# Patient Record
Sex: Male | Born: 1950 | Race: White | Hispanic: No | Marital: Single | Smoking: Current every day smoker
Health system: Southern US, Community
[De-identification: ages and names within clinical notes are randomized; demographics above are authoritative.]

## PROBLEM LIST (undated history)

## (undated) DIAGNOSIS — M1990 Unspecified osteoarthritis, unspecified site: Secondary | ICD-10-CM

## (undated) DIAGNOSIS — G4730 Sleep apnea, unspecified: Secondary | ICD-10-CM

## (undated) DIAGNOSIS — I739 Peripheral vascular disease, unspecified: Secondary | ICD-10-CM

## (undated) DIAGNOSIS — I10 Essential (primary) hypertension: Secondary | ICD-10-CM

## (undated) DIAGNOSIS — K219 Gastro-esophageal reflux disease without esophagitis: Secondary | ICD-10-CM

## (undated) HISTORY — PX: RHINOPLASTY: SUR1284

## (undated) HISTORY — PX: HERNIA REPAIR: SHX51

## (undated) HISTORY — PX: OTHER SURGICAL HISTORY: SHX169

## (undated) HISTORY — PX: CONJUNCTIVOPLASTY: SHX1390

## (undated) HISTORY — PX: COLONOSCOPY: SHX174

---

## 2020-09-28 ENCOUNTER — Ambulatory Visit: Payer: Self-pay

## 2020-09-28 ENCOUNTER — Ambulatory Visit (INDEPENDENT_AMBULATORY_CARE_PROVIDER_SITE_OTHER): Payer: Medicare PPO | Admitting: Orthopaedic Surgery

## 2020-09-28 VITALS — Ht 70.0 in | Wt 223.0 lb

## 2020-09-28 DIAGNOSIS — M1612 Unilateral primary osteoarthritis, left hip: Secondary | ICD-10-CM

## 2020-09-28 DIAGNOSIS — M25552 Pain in left hip: Secondary | ICD-10-CM

## 2020-09-28 NOTE — Progress Notes (Signed)
Office Visit Note   Patient: Samuel Dougherty           Date of Birth: September 21, 1950           MRN: [id number] Visit Date: 09/28/2020              Requested by: No referring provider defined for this encounter. PCP: No primary care provider on file.   Assessment & Plan: Visit Diagnoses:  1. Pain in left hip   2. Unilateral primary osteoarthritis, left hip     Plan: The patient does have severe end-stage arthritis of his left hip and this is definitely demonstrated on clinical exam and x-ray findings.  Given the failure of all forms of conservative treatment over 12 months, I do feel a hip replacement is warranted at this standpoint.  I showed him his x-rays and went over hip replacement model.  We described in detail what the surgery involves as well as discussed the risk and benefits of surgery in detail.  I gave him a handout about the surgery and showed him what hip replacements look like.  All questions and concerns were answered and addressed.  We will work on getting this scheduled.  Follow-Up Instructions: Return for 2 weeks post-op.   Orders:  Orders Placed This Encounter  Procedures   XR HIP UNILAT W OR W/O PELVIS 1V LEFT   No orders of the defined types were placed in this encounter.     Procedures: No procedures performed   Clinical Data: No additional findings.   Subjective: Chief Complaint  Patient presents with   Left Hip - Pain  The patient is a very pleasant 70 year old gentleman who comes all the way from [facility] to see me for his left hip.  He has known severe end-stage arthritis in that left hip and has been recommended that he undergo hip replacement surgery.  He would like this to be done through a direct anterior approach in his heard about me and came all this way for [place] to see him.  He does report severe left hip pain that is daily.  It is detrimentally affecting his mobility, his quality of life and his actives daily living.  He has had  at least 3 intra-articular steroid injections over time and that left hip joint.  This is gotten significantly worse over the last 12 months and activity modification has not helped.  He is worked on strengthening his hip and is lost a considerable amount of weight.  His BMI is now only 32.  He cannot take anti-inflammatories because he is on Plavix.  He is now diabetic.  At this point he wishes proceed with hip replacement surgery and would like to discuss this today.  HPI  Review of Systems   Objective: Vital Signs: Ht 5\' 10"  (1.778 m)   Wt 223 lb (101.2 kg)   BMI 32.00 kg/m   Physical Exam He is alert and orient x3 and in no acute distress Ortho Exam Examination of his left hip shows severe pain on internal and external rotation and grinding in that left hip on rotation.  The right hip moves smoothly and fluidly with no pain. Specialty Comments:  No specialty comments available.  Imaging: XR HIP UNILAT W OR W/O PELVIS 1V LEFT  Result Date: 09/28/2020 An AP pelvis and lateral left hip shows severe end-stage arthritis of the left hip.  The joint space is completely lost.  There is a small cystic change in the  acetabulum and femoral head.  This is the classic definition of bone-on-bone arthritis.  His right hip does have moderate arthritic changes with joint space narrowing.    PMFS History: Patient Active Problem List   Diagnosis Date Noted   Unilateral primary osteoarthritis, left hip 09/28/2020   No past medical history on file.  No family history on file.   Social History   Occupational History   Not on file  Tobacco Use   Smoking status: Not on file   Smokeless tobacco: Not on file  Substance and Sexual Activity   Alcohol use: Not on file   Drug use: Not on file   Sexual activity: Not on file

## 2020-09-30 ENCOUNTER — Telehealth: Payer: Self-pay | Admitting: Orthopaedic Surgery

## 2020-09-30 NOTE — Telephone Encounter (Signed)
Called and spoke with patient letting him know there are many in front of him being scheduled and that Sherrie will call usually within a week and a half of office visit to schedule. I also reminded him that surgery will most likely not be until August

## 2020-09-30 NOTE — Telephone Encounter (Signed)
Pt called stating he has been trying to get an appt scheduled all week. He states he has left messages and he hasn't even gotten a CB to let him know how much longer he may have to wait. The pt has to drive 2 hrs and he is trying to get info without having to come up here. Pt would really appreciate a CB ASAP as he's been waiting a week and he can't walk.   [phone]

## 2020-10-05 ENCOUNTER — Ambulatory Visit: Payer: Medicare PPO | Admitting: Orthopaedic Surgery

## 2020-10-07 ENCOUNTER — Telehealth: Payer: Self-pay

## 2020-10-07 NOTE — Telephone Encounter (Signed)
I called and left voice mail for return call to schedule. 

## 2020-10-07 NOTE — Telephone Encounter (Signed)
Pt called returning the missed call and asks Sherrie to try him again before the day is out.  [phone]

## 2020-10-07 NOTE — Telephone Encounter (Signed)
I called patient and left voice mail for return call to schedule surgery. 

## 2020-10-10 ENCOUNTER — Telehealth: Payer: Self-pay | Admitting: Orthopaedic Surgery

## 2020-10-10 NOTE — Telephone Encounter (Signed)
Pt called stating he missed a call from [facility] and would like to be tried again.

## 2020-10-10 NOTE — Telephone Encounter (Signed)
Called patient and left voicemail for return call.

## 2020-10-18 ENCOUNTER — Telehealth: Payer: Self-pay

## 2020-10-18 ENCOUNTER — Telehealth: Payer: Self-pay | Admitting: Orthopaedic Surgery

## 2020-10-18 NOTE — Telephone Encounter (Signed)
Patient called he is requesting a medical records release form to be sent to him, patient is requesting all of his records including x rays to be sent email:[email or web address] call [phone]  ** patient needs records asap

## 2020-10-18 NOTE — Telephone Encounter (Signed)
Please see below message.

## 2020-10-18 NOTE — Telephone Encounter (Signed)
Received call from pt, he wants release form sent to him so he can go to another doctor to do his surgery. He states he wants Dr. Magnus Ivan to do his surgery but, cannot get anyone to call him back. He asked that I relay this information to Dr Magnus Ivan because he needs to know. He states he is willing to wait for Magnus Ivan, but can't move forward to even talk to someone to get it scheduled. If possible, he would like to talk to Dr Magnus Ivan himself and would really ALSO, like for someone to call him today to schedule his surgery [phone]

## 2020-10-18 NOTE — Telephone Encounter (Signed)
I called patient and he answered the phone.  I explained to him that I had left him multiple messages about scheduling surgery.  I even left him a message about the surgery date and asked him to call me back for details.  He stated that it was probably his phone service.  He does want Dr. Magnus Ivan to do the surgery.  He asked me to e-mail him the information because he was in line somewhere with nothing to write with.  I e-mailed the following to [email or web address]  STOP YOUR PLAVIX, TAKE LAST DOSE ON 11/17/20.    1.  Your surgery has been scheduled at [facility], [address] [place], [place].  If you have any questions, please feel free to call our office and ask for Ryan Ogborn.      2.  The hospital will contact you for your pre-admission laboratory tests and financial arrangements, etc.  Should you need to call them back, the number is [phone].        3.  Your surgery is scheduled for   Friday, 11/25/20   .  Time: to be determined.  On the day of surgery, you will need to report to the hospital at time to be determined.  **Please note your surgery time is subject to change.  The hospital   will confirm your surgery time with you at your pre-op appointment .**      4.  IT IS OF THE UTMOST IMPORTANCE THAT YOU DO NOT EAT OR DRINK ANYTHING AFTER MIDNIGHT THE NIGHT BEFORE YOUR SURGERY...DO NOT SWALLOW ANYTHING, UNLESS GIVEN INSTRUCTIONS FROM THE HOSPITAL STAFF TO DO SO.      5.  You will be assigned to a room following surgery.  Dress comfortably and do not take any jewelry or valuables with you to the hospital.     Your first post-operative appointment is scheduled for Thursday, 12/08/20 2:00 p.m.

## 2020-10-21 ENCOUNTER — Other Ambulatory Visit: Payer: Self-pay

## 2020-11-14 NOTE — Progress Notes (Signed)
Sent message, via epic in basket, requesting orders in epic from surgeon.  

## 2020-11-17 NOTE — Patient Instructions (Signed)
DUE TO COVID-19 ONLY ONE VISITOR IS ALLOWED TO COME WITH YOU AND STAY IN THE WAITING ROOM ONLY DURING PRE OP AND PROCEDURE.   **NO VISITORS ARE ALLOWED IN THE SHORT STAY AREA OR RECOVERY ROOM!!**  IF YOU WILL BE ADMITTED INTO THE HOSPITAL YOU ARE ALLOWED ONLY TWO SUPPORT PEOPLE DURING VISITATION HOURS ONLY (10AM -8PM)   The support person(s) may change daily. The support person(s) must pass our screening, gel in and out, and wear a mask at all times, including in the patient's room. Patients must also wear a mask when staff or their support person are in the room.  No visitors under the age of 37. Any visitor under the age of 45 must be accompanied by an adult.    COVID SWAB TESTING MUST BE COMPLETED ON:  11/23/20 **MUST PRESENT COMPLETED FORM AT TESTING SITE**    [address] [place] (backside of the building) Open 8am-3pm. No appointment needed. You are not required to quarantine, however you are required to wear a well-fitted mask when you are out and around people not in your household.  Hand Hygiene often Do NOT share personal items Notify your provider if you are in close contact with someone who has COVID or you develop fever 100.4 or greater, new onset of sneezing, cough, sore throat, shortness of breath or body aches.   Your procedure is scheduled on: 11/25/20   Report to [facility] Main  Entrance    Report to admitting at 7:00 AM   Call this number if you have problems the morning of surgery [phone]   Do not eat food :After Midnight.   May have liquids until  6:30 AM  day of surgery  CLEAR LIQUID DIET  Foods Allowed                                                                     Foods Excluded  Water, Black Coffee and tea, regular and decaf               liquids that you cannot  Plain Jell-O in any flavor  (No red)                                    see through such as: Fruit ices (not with fruit pulp)                                             milk, soups, orange juice              Iced Popsicles (No red)                                               All solid food  Apple juices Sports drinks like Gatorade (No red) Lightly seasoned clear broth or consume(fat free) Sugar, honey syrup      The day of surgery:  Drink ONE (1) Pre-Surgery Clear Ensure or G2 by am the morning of surgery. Drink in one sitting. Do not sip.  This drink was given to you during your hospital  pre-op appointment visit. Nothing else to drink after completing the  Pre-Surgery Clear Ensure or G2.          If you have questions, please contact your surgeon's office.     Oral Hygiene is also important to reduce your risk of infection.                                    Remember - BRUSH YOUR TEETH THE MORNING OF SURGERY WITH YOUR REGULAR TOOTHPASTE   Do NOT smoke after Midnight   Take these medicines the morning of surgery with A SIP OF WATER: Atenolol, Atorvastatin, Omeprazole, Oxycodone, Pramipexole, Tamsulosin.   DO NOT TAKE ANY ORAL DIABETIC MEDICATIONS DAY OF YOUR SURGERY                              You may not have any metal on your body including jewelry, and body piercing             Do not wear lotions, powders, cologne, or deodorant              Men may shave face and neck.   Do not bring valuables to the hospital. [facility] IS NOT             RESPONSIBLE   FOR VALUABLES.   Contacts, dentures or bridgework may not be worn into surgery.   Bring small overnight bag day of surgery.   Special Instructions: Bring a copy of your healthcare power of attorney and living will documents         the day of surgery if you haven't scanned them in before.   Please read over the following fact sheets you were given: IF YOU HAVE QUESTIONS ABOUT YOUR PRE OP INSTRUCTIONS PLEASE CALL [phone]    - Preparing for Surgery Before surgery, you can play an important role.  Because skin is not sterile,  your skin needs to be as free of germs as possible.  You can reduce the number of germs on your skin by washing with CHG (chlorahexidine gluconate) soap before surgery.  CHG is an antiseptic cleaner which kills germs and bonds with the skin to continue killing germs even after washing. Please DO NOT use if you have an allergy to CHG or antibacterial soaps.  If your skin becomes reddened/irritated stop using the CHG and inform your nurse when you arrive at Short Stay. Do not shave (including legs and underarms) for at least 48 hours prior to the first CHG shower.  You may shave your face/neck.  Please follow these instructions carefully:  1.  Shower with CHG Soap the night before surgery and the  morning of surgery.  2.  If you choose to wash your hair, wash your hair first as usual with your normal  shampoo.  3.  After you shampoo, rinse your hair and body thoroughly to remove the shampoo.  4.  Use CHG as you would any other liquid soap.  You can apply chg directly to the skin and wash.  Gently with a scrungie or clean washcloth.  5.  Apply the CHG Soap to your body ONLY FROM THE NECK DOWN.   Do   not use on face/ open                           Wound or open sores. Avoid contact with eyes, ears mouth and   genitals (private parts).                       Wash face,  Genitals (private parts) with your normal soap.             6.  Wash thoroughly, paying special attention to the area where your    surgery  will be performed.  7.  Thoroughly rinse your body with warm water from the neck down.  8.  DO NOT shower/wash with your normal soap after using and rinsing off the CHG Soap.                9.  Pat yourself dry with a clean towel.            10.  Wear clean pajamas.            11.  Place clean sheets on your bed the night of your first shower and do not  sleep with pets. Day of Surgery : Do not apply any lotions/deodorants the morning of surgery.  Please wear clean  clothes to the hospital/surgery center.  FAILURE TO FOLLOW THESE INSTRUCTIONS MAY RESULT IN THE CANCELLATION OF YOUR SURGERY  PATIENT SIGNATURE_________________________________  NURSE SIGNATURE__________________________________  ________________________________________________________________________  WHAT IS A BLOOD TRANSFUSION? Blood Transfusion Information  A transfusion is the replacement of blood or some of its parts. Blood is made up of multiple cells which provide different functions. Red blood cells carry oxygen and are used for blood loss replacement. White blood cells fight against infection. Platelets control bleeding. Plasma helps clot blood. Other blood products are available for specialized needs, such as hemophilia or other clotting disorders. BEFORE THE TRANSFUSION  Who gives blood for transfusions?  Healthy volunteers who are fully evaluated to make sure their blood is safe. This is blood bank blood. Transfusion therapy is the safest it has ever been in the practice of medicine. Before blood is taken from a donor, a complete history is taken to make sure that person has no history of diseases nor engages in risky social behavior (examples are intravenous drug use or sexual activity with multiple partners). The donor's travel history is screened to minimize risk of transmitting infections, such as malaria. The donated blood is tested for signs of infectious diseases, such as HIV and hepatitis. The blood is then tested to be sure it is compatible with you in order to minimize the chance of a transfusion reaction. If you or a relative donates blood, this is often done in anticipation of surgery and is not appropriate for emergency situations. It takes many days to process the donated blood. RISKS AND COMPLICATIONS Although transfusion therapy is very safe and saves many lives, the main dangers of transfusion include:  Getting an infectious disease. Developing a transfusion  reaction. This is an allergic reaction to something in the blood you were given. Every precaution is taken to prevent this. The decision to  have a blood transfusion has been considered carefully by your caregiver before blood is given. Blood is not given unless the benefits outweigh the risks. AFTER THE TRANSFUSION Right after receiving a blood transfusion, you will usually feel much better and more energetic. This is especially true if your red blood cells have gotten low (anemic). The transfusion raises the level of the red blood cells which carry oxygen, and this usually causes an energy increase. The nurse administering the transfusion will monitor you carefully for complications. HOME CARE INSTRUCTIONS  No special instructions are needed after a transfusion. You may find your energy is better. Speak with your caregiver about any limitations on activity for underlying diseases you may have. SEEK MEDICAL CARE IF:  Your condition is not improving after your transfusion. You develop redness or irritation at the intravenous (IV) site. SEEK IMMEDIATE MEDICAL CARE IF:  Any of the following symptoms occur over the next 12 hours: Shaking chills. You have a temperature by mouth above 102 F (38.9 C), not controlled by medicine. Chest, back, or muscle pain. People around you feel you are not acting correctly or are confused. Shortness of breath or difficulty breathing. Dizziness and fainting. You get a rash or develop hives. You have a decrease in urine output. Your urine turns a dark color or changes to pink, red, or brown. Any of the following symptoms occur over the next 10 days: You have a temperature by mouth above 102 F (38.9 C), not controlled by medicine. Shortness of breath. Weakness after normal activity. The white part of the eye turns yellow (jaundice). You have a decrease in the amount of urine or are urinating less often. Your urine turns a dark color or changes to pink, red, or  brown. Document Released: 03/30/2000 Document Revised: 06/25/2011 Document Reviewed: 11/17/2007 [facility] Patient Information 2014 [place], [place].  _______________________________________________________________________

## 2020-11-17 NOTE — Progress Notes (Signed)
COVID Vaccine Completed: Date COVID Vaccine completed: Has received booster: COVID vaccine manufacturer: [employer]   Date of COVID positive in last 90 days:  PCP -  Cardiologist -   Chest x-ray -  EKG -  Stress Test -  ECHO -  Cardiac Cath -  Pacemaker/ICD device last checked: Spinal Cord Stimulator:  Sleep Study -  CPAP -   Fasting Blood Sugar -  Checks Blood Sugar _____ times a day  Blood Thinner Instructions: Plavix, stop 11/17/20 Aspirin Instructions: Last Dose:  Activity level:  Can go up a flight of stairs and perform activities of daily living without stopping and without symptoms of chest pain or shortness of breath.   Able to exercise without symptoms  Unable to go up a flight of stairs without symptoms of      Anesthesia review:   Patient denies shortness of breath, fever, cough and chest pain at PAT appointment   Patient verbalized understanding of instructions that were given to them at the PAT appointment. Patient was also instructed that they will need to review over the PAT instructions again at home before surgery.

## 2020-11-18 ENCOUNTER — Encounter (HOSPITAL_COMMUNITY)
Admission: RE | Admit: 2020-11-18 | Discharge: 2020-11-18 | Disposition: A | Discharge status: Home / Self Care | Payer: Medicare PPO | Source: Ambulatory Visit | Attending: Anesthesiology | Admitting: Anesthesiology

## 2020-11-18 ENCOUNTER — Other Ambulatory Visit: Payer: Self-pay | Admitting: Physician Assistant

## 2020-11-18 ENCOUNTER — Telehealth: Payer: Self-pay | Admitting: Radiology

## 2020-11-18 NOTE — Telephone Encounter (Signed)
Charity with [facility] called triage line this morning stating she had received call from patient needing to reschedule his pre-op appointment for today because something had come up. She explained to patient that she did not do this, but would reach out to the office. Patient told her he had been trying to reach [place] with no luck.  Could you please reschedule pre-op appt? Patient is scheduled for surgery on 11/25/2020 with Dr. Magnus Ivan.  CB for patient [phone]

## 2020-11-18 NOTE — Telephone Encounter (Signed)
I called patient and advised [facility] PAT had to reschedule with him.  I conferenced them in with our call and he was able to reschedule.

## 2020-11-22 ENCOUNTER — Encounter (HOSPITAL_COMMUNITY): Payer: Self-pay

## 2020-11-22 ENCOUNTER — Other Ambulatory Visit: Payer: Self-pay

## 2020-11-22 ENCOUNTER — Encounter (HOSPITAL_COMMUNITY)
Admission: RE | Admit: 2020-11-22 | Discharge: 2020-11-22 | Disposition: A | Discharge status: Home / Self Care | Payer: Medicare PPO | Source: Ambulatory Visit | Attending: Orthopaedic Surgery | Admitting: Orthopaedic Surgery

## 2020-11-22 DIAGNOSIS — Z01818 Encounter for other preprocedural examination: Secondary | ICD-10-CM | POA: Diagnosis present

## 2020-11-22 DIAGNOSIS — E119 Type 2 diabetes mellitus without complications: Secondary | ICD-10-CM | POA: Insufficient documentation

## 2020-11-22 HISTORY — DX: Sleep apnea, unspecified: G47.30

## 2020-11-22 HISTORY — DX: Peripheral vascular disease, unspecified: I73.9

## 2020-11-22 HISTORY — DX: Gastro-esophageal reflux disease without esophagitis: K21.9

## 2020-11-22 HISTORY — DX: Unspecified osteoarthritis, unspecified site: M19.90

## 2020-11-22 HISTORY — DX: Essential (primary) hypertension: I10

## 2020-11-22 LAB — BASIC METABOLIC PANEL
Anion gap: 12 (ref 5–15)
BUN: 25 mg/dL — ABNORMAL HIGH (ref 8–23)
CO2: 27 mmol/L (ref 22–32)
Calcium: 10.5 mg/dL — ABNORMAL HIGH (ref 8.9–10.3)
Chloride: 99 mmol/L (ref 98–111)
Creatinine, Ser: 0.9 mg/dL (ref 0.61–1.24)
GFR, Estimated: >60 mL/min (ref >60)
Glucose, Bld: 110 mg/dL — ABNORMAL HIGH (ref 70–99)
Potassium: 3.8 mmol/L (ref 3.5–5.1)
Sodium: 138 mmol/L (ref 135–145)

## 2020-11-22 LAB — HEMOGLOBIN A1C
Hgb A1c MFr Bld: 6 % — ABNORMAL HIGH (ref 4.8–5.6)
Mean Plasma Glucose: 125.5 mg/dL

## 2020-11-22 LAB — CBC
HCT: 44.4 % (ref 39.0–52.0)
Hemoglobin: 14.6 g/dL (ref 13.0–17.0)
MCH: 31.7 pg (ref 26.0–34.0)
MCHC: 32.9 g/dL (ref 30.0–36.0)
MCV: 96.5 fL (ref 80.0–100.0)
Platelets: 270 10*3/uL (ref 150–400)
RBC: 4.6 MIL/uL (ref 4.22–5.81)
RDW: 13.3 % (ref 11.5–15.5)
WBC: 8.6 10*3/uL (ref 4.0–10.5)
nRBC: 0 % (ref 0.0–0.2)

## 2020-11-22 LAB — GLUCOSE, CAPILLARY: Glucose-Capillary: 130 mg/dL — ABNORMAL HIGH (ref 70–99)

## 2020-11-22 NOTE — Progress Notes (Addendum)
COVID Vaccine Completed: yes x2 Date COVID Vaccine completed: Has received booster: COVID vaccine manufacturer: Moderna     Date of COVID positive in last 90 days: No  PCP - Malena Catholic, MD Cardiologist -   Chest x-ray -  EKG - 11/22/20 Epic Stress Test - few years ago per pt ECHO - ECHO last year per pt  Cardiac Cath - N/a Pacemaker/ICD device last checked: N/a Spinal Cord Stimulator: N/a  Sleep Study - positive sleep apnea CPAP - yes every night  Fasting Blood Sugar -  pt denies DM, says he takes Metformin for weight loss. No glucometer at home   Blood Thinner Instructions: Plavix, stop 11/17/20 Aspirin Instructions: not taking since few days ago. Patient cannot remember exact day Last Dose: patient is unsure. Says he stopped about a week ago.  Activity level: Can perform activities of daily living without stopping and without symptoms of chest pain or shortness of breath. Difficulty with stairs due to left hip. Can get up a few but it is very difficult for patient.     Anesthesia review:  Patient denies shortness of breath, fever, cough and chest pain at PAT appointment   Patient verbalized understanding of instructions that were given to them at the PAT appointment. Patient was also instructed that they will need to review over the PAT instructions again at home before surgery.

## 2020-11-22 NOTE — Patient Instructions (Addendum)
DUE TO COVID-19 ONLY ONE VISITOR IS ALLOWED TO COME WITH YOU AND STAY IN THE WAITING ROOM ONLY DURING PRE OP AND PROCEDURE.   **NO VISITORS ARE ALLOWED IN THE SHORT STAY AREA OR RECOVERY ROOM!!**  IF YOU WILL BE ADMITTED INTO THE HOSPITAL YOU ARE ALLOWED ONLY TWO SUPPORT PEOPLE DURING VISITATION HOURS ONLY (10AM -8PM)   The support person(s) may change daily. The support person(s) must pass our screening, gel in and out, and wear a mask at all times, including in the patient's room. Patients must also wear a mask when staff or their support person are in the room.  No visitors under the age of 69. Any visitor under the age of 8 must be accompanied by an adult.    COVID SWAB TESTING MUST BE COMPLETED ON:  11/23/20 Please get a COIVD test 11/23/20. Test must be a PCR test. No at home test accepted. Have facility fax results to [phone] with documentation of test results and include where test was obtained. You are not required to quarantine, however you are required to wear a well-fitted mask when you are out and around people not in your household.  Hand Hygiene often Do NOT share personal items Notify your provider if you are in close contact with someone who has COVID or you develop fever 100.4 or greater, new onset of sneezing, cough, sore throat, shortness of breath or body aches.       Your procedure is scheduled on: 11/25/20   Report to [facility] Main  Entrance    Report to admitting at 7:00 AM   Call this number if you have problems the morning of surgery [phone]   Do not eat food :After Midnight.   May have liquids until  6:30 AM  day of surgery  CLEAR LIQUID DIET  Foods Allowed                                                                     Foods Excluded  Water, Black Coffee and tea, regular and decaf                liquids that you cannot  Plain Jell-O in any flavor  (No red)                                      see through such as: Fruit ices (not  with fruit pulp)                                             milk, soups, orange juice              Iced Popsicles (No red)                                                 All solid food  Apple juices Sports drinks like Gatorade (No red) Lightly seasoned clear broth or consume(fat free) Sugar, honey syrup   The day of surgery:  Drink ONE (1) Pre-Surgery G2 by 6:30 am the morning of surgery. Drink in one sitting. Do not sip.  This drink was given to you during your hospital  pre-op appointment visit. Nothing else to drink after completing the  Pre-Surgery G2.          If you have questions, please contact your surgeon's office.     Oral Hygiene is also important to reduce your risk of infection.                                    Remember - BRUSH YOUR TEETH THE MORNING OF SURGERY WITH YOUR REGULAR TOOTHPASTE   Do NOT smoke after Midnight   Take these medicines the morning of surgery with A SIP OF WATER: Atenolol, Atorvastatin, Omeprazole, Oxycodone, Pramipexole, Tamsulosin.  DO NOT TAKE ANY ORAL DIABETIC MEDICATIONS DAY OF YOUR SURGERY  WHAT DO I DO ABOUT MY DIABETES MEDICATION?  Do not take oral diabetes medicines (pills) the morning of surgery.  THE DAY BEFORE SURGERY, take Metformin as prescribed       THE MORNING OF SURGERY, do not take Metformin  Reviewed and Endorsed by [facility] Patient Education Committee, August 2015                               You may not have any metal on your body including jewelry, and body piercing             Do not wear lotions, powders, cologne, or deodorant              Men may shave face and neck.   Do not bring valuables to the hospital. [facility] IS NOT             RESPONSIBLE   FOR VALUABLES.   Contacts, dentures or bridgework may not be worn into surgery.   Bring small overnight bag day of surgery.   Special Instructions: Bring a copy of your healthcare power of attorney and living will  documents         the day of surgery if you haven't scanned them in before.  Please read over the following fact sheets you were given: IF YOU HAVE QUESTIONS ABOUT YOUR PRE OP INSTRUCTIONS PLEASE CALL [phone]-Samuel Dougherty   [facility] - Preparing for Surgery Before surgery, you can play an important role.  Because skin is not sterile, your skin needs to be as free of germs as possible.  You can reduce the number of germs on your skin by washing with CHG (chlorahexidine gluconate) soap before surgery.  CHG is an antiseptic cleaner which kills germs and bonds with the skin to continue killing germs even after washing. Please DO NOT use if you have an allergy to CHG or antibacterial soaps.  If your skin becomes reddened/irritated stop using the CHG and inform your nurse when you arrive at Short Stay. Do not shave (including legs and underarms) for at least 48 hours prior to the first CHG shower.  You may shave your face/neck.  Please follow these instructions carefully:  1.  Shower with CHG Soap the night before surgery and the  morning of surgery.  2.  If you choose to  wash your hair, wash your hair first as usual with your normal  shampoo.  3.  After you shampoo, rinse your hair and body thoroughly to remove the shampoo.                             4.  Use CHG as you would any other liquid soap.  You can apply chg directly to the skin and wash.  Gently with a scrungie or clean washcloth.  5.  Apply the CHG Soap to your body ONLY FROM THE NECK DOWN.   Do   not use on face/ open                           Wound or open sores. Avoid contact with eyes, ears mouth and   genitals (private parts).                       Wash face,  Genitals (private parts) with your normal soap.             6.  Wash thoroughly, paying special attention to the area where your    surgery  will be performed.  7.  Thoroughly rinse your body with warm water from the neck down.  8.  DO NOT shower/wash with your normal soap after  using and rinsing off the CHG Soap.                9.  Pat yourself dry with a clean towel.            10.  Wear clean pajamas.            11.  Place clean sheets on your bed the night of your first shower and do not  sleep with pets. Day of Surgery : Do not apply any lotions/deodorants the morning of surgery.  Please wear clean clothes to the hospital/surgery center.  FAILURE TO FOLLOW THESE INSTRUCTIONS MAY RESULT IN THE CANCELLATION OF YOUR SURGERY  PATIENT SIGNATURE_________________________________  NURSE SIGNATURE__________________________________  ________________________________________________________________________   Samuel Dougherty  An incentive spirometer is a tool that can help keep your lungs clear and active. This tool measures how well you are filling your lungs with each breath. Taking long deep breaths may help reverse or decrease the chance of developing breathing (pulmonary) problems (especially infection) following: A long period of time when you are unable to move or be active. BEFORE THE PROCEDURE  If the spirometer includes an indicator to show your best effort, your nurse or respiratory therapist will set it to a desired goal. If possible, sit up straight or lean slightly forward. Try not to slouch. Hold the incentive spirometer in an upright position. INSTRUCTIONS FOR USE  Sit on the edge of your bed if possible, or sit up as far as you can in bed or on a chair. Hold the incentive spirometer in an upright position. Breathe out normally. Place the mouthpiece in your mouth and seal your lips tightly around it. Breathe in slowly and as deeply as possible, raising the piston or the ball toward the top of the column. Hold your breath for 3-5 seconds or for as long as possible. Allow the piston or ball to fall to the bottom of the column. Remove the mouthpiece from your mouth and breathe out normally. Rest for a few seconds and repeat Steps 1 through 7 at  least  10 times every 1-2 hours when you are awake. Take your time and take a few normal breaths between deep breaths. The spirometer may include an indicator to show your best effort. Use the indicator as a goal to work toward during each repetition. After each set of 10 deep breaths, practice coughing to be sure your lungs are clear. If you have an incision (the cut made at the time of surgery), support your incision when coughing by placing a pillow or rolled up towels firmly against it. Once you are able to get out of bed, walk around indoors and cough well. You may stop using the incentive spirometer when instructed by your caregiver.  RISKS AND COMPLICATIONS Take your time so you do not get dizzy or light-headed. If you are in pain, you may need to take or ask for pain medication before doing incentive spirometry. It is harder to take a deep breath if you are having pain. AFTER USE Rest and breathe slowly and easily. It can be helpful to keep track of a log of your progress. Your caregiver can provide you with a simple table to help with this. If you are using the spirometer at home, follow these instructions: SEEK MEDICAL CARE IF:  You are having difficultly using the spirometer. You have trouble using the spirometer as often as instructed. Your pain medication is not giving enough relief while using the spirometer. You develop fever of 100.5 F (38.1 C) or higher. SEEK IMMEDIATE MEDICAL CARE IF:  You cough up bloody sputum that had not been present before. You develop fever of 102 F (38.9 C) or greater. You develop worsening pain at or near the incision site. MAKE SURE YOU:  Understand these instructions. Will watch your condition. Will get help right away if you are not doing well or get worse. Document Released: 08/13/2006 Document Revised: 06/25/2011 Document Reviewed: 10/14/2006 ExitCare Patient Information 2014 ExitCare,  [place].   ________________________________________________________________________  WHAT IS A BLOOD TRANSFUSION? Blood Transfusion Information  A transfusion is the replacement of blood or some of its parts. Blood is made up of multiple cells which provide different functions. Red blood cells carry oxygen and are used for blood loss replacement. White blood cells fight against infection. Platelets control bleeding. Plasma helps clot blood. Other blood products are available for specialized needs, such as hemophilia or other clotting disorders. BEFORE THE TRANSFUSION  Who gives blood for transfusions?  Healthy volunteers who are fully evaluated to make sure their blood is safe. This is blood bank blood. Transfusion therapy is the safest it has ever been in the practice of medicine. Before blood is taken from a donor, a complete history is taken to make sure that person has no history of diseases nor engages in risky social behavior (examples are intravenous drug use or sexual activity with multiple partners). The donor's travel history is screened to minimize risk of transmitting infections, such as malaria. The donated blood is tested for signs of infectious diseases, such as HIV and hepatitis. The blood is then tested to be sure it is compatible with you in order to minimize the chance of a transfusion reaction. If you or a relative donates blood, this is often done in anticipation of surgery and is not appropriate for emergency situations. It takes many days to process the donated blood. RISKS AND COMPLICATIONS Although transfusion therapy is very safe and saves many lives, the main dangers of transfusion include:  Getting an infectious disease. Developing a transfusion  reaction. This is an allergic reaction to something in the blood you were given. Every precaution is taken to prevent this. The decision to have a blood transfusion has been considered carefully by your caregiver before blood is  given. Blood is not given unless the benefits outweigh the risks. AFTER THE TRANSFUSION Right after receiving a blood transfusion, you will usually feel much better and more energetic. This is especially true if your red blood cells have gotten low (anemic). The transfusion raises the level of the red blood cells which carry oxygen, and this usually causes an energy increase. The nurse administering the transfusion will monitor you carefully for complications. HOME CARE INSTRUCTIONS  No special instructions are needed after a transfusion. You may find your energy is better. Speak with your caregiver about any limitations on activity for underlying diseases you may have. SEEK MEDICAL CARE IF:  Your condition is not improving after your transfusion. You develop redness or irritation at the intravenous (IV) site. SEEK IMMEDIATE MEDICAL CARE IF:  Any of the following symptoms occur over the next 12 hours: Shaking chills. You have a temperature by mouth above 102 F (38.9 C), not controlled by medicine. Chest, back, or muscle pain. People around you feel you are not acting correctly or are confused. Shortness of breath or difficulty breathing. Dizziness and fainting. You get a rash or develop hives. You have a decrease in urine output. Your urine turns a dark color or changes to pink, red, or brown. Any of the following symptoms occur over the next 10 days: You have a temperature by mouth above 102 F (38.9 C), not controlled by medicine. Shortness of breath. Weakness after normal activity. The white part of the eye turns yellow (jaundice). You have a decrease in the amount of urine or are urinating less often. Your urine turns a dark color or changes to pink, red, or brown. Document Released: 03/30/2000 Document Revised: 06/25/2011 Document Reviewed: 11/17/2007 [facility] Patient Information 2014 [place], [place].  _______________________________________________________________________

## 2020-11-23 LAB — SURGICAL PCR SCREEN
MRSA, PCR: POSITIVE — AB
Staphylococcus aureus: POSITIVE — AB

## 2020-11-24 NOTE — Progress Notes (Signed)
Called pt who states he had a covid test 11/23/20 at his primary MD in [place], [place]. He cannot find results at his house. He is instructed to come in at 0630 on 11/25/20 so that a covid test may be done. He states he lost his pre op instructions. Reviewed the 6 medications that PST instructed him to take. Reviewed NPO status after midnight. Reviewed shower tonight and in the AM. He knows about ERAS drink to be completed by 0630 in AM.

## 2020-11-24 NOTE — H&P (Signed)
TOTAL HIP ADMISSION H&P  Patient is admitted for left total hip arthroplasty.  Subjective:  Chief Complaint: left hip pain  HPI: Samuel Dougherty, 70 y.o. male, has a history of pain and functional disability in the left hip(s) due to arthritis and patient has failed non-surgical conservative treatments for greater than 12 weeks to include corticosteriod injections, flexibility and strengthening excercises, weight reduction as appropriate, and activity modification.  Onset of symptoms was gradual starting 2 years ago with gradually worsening course since that time.The patient noted no past surgery on the left hip(s).  Patient currently rates pain in the left hip at 10 out of 10 with activity. Patient has night pain, worsening of pain with activity and weight bearing, trendelenberg gait, pain that interfers with activities of daily living, and pain with passive range of motion. Patient has evidence of subchondral cysts, subchondral sclerosis, periarticular osteophytes, and joint space narrowing by imaging studies. This condition presents safety issues increasing the risk of falls.  There is no current active infection.  Patient Active Problem List   Diagnosis Date Noted   Unilateral primary osteoarthritis, left hip 09/28/2020   Past Medical History:  Diagnosis Date   Arthritis    GERD (gastroesophageal reflux disease)    Hypertension    Peripheral angiopathy (HCC)    Sleep apnea     Past Surgical History:  Procedure Laterality Date   COLONOSCOPY     CONJUNCTIVOPLASTY     graft ear cartilage     HERNIA REPAIR     repair ectropion extensive left     RHINOPLASTY      No current facility-administered medications for this encounter.   Current Outpatient Medications  Medication Sig Dispense Refill Last Dose   Ascorbic Acid (VITAMIN C) 1000 MG tablet Take 2,000 mg by mouth daily.      aspirin EC 81 MG tablet Take 81 mg by mouth daily. Swallow whole.      atenolol (TENORMIN) 100 MG tablet  Take 100 mg by mouth daily.      atorvastatin (LIPITOR) 40 MG tablet Take 40 mg by mouth daily.      clopidogrel (PLAVIX) 75 MG tablet Take 75 mg by mouth daily.      diclofenac Sodium (VOLTAREN) 1 % GEL Apply 1 application topically 4 (four) times daily as needed (pain).      furosemide (LASIX) 40 MG tablet Take 40 mg by mouth daily.      hydrochlorothiazide (HYDRODIURIL) 25 MG tablet Take 25 mg by mouth daily.      magnesium oxide (MAG-OX) 400 MG tablet Take 400 mg by mouth daily.      metFORMIN (GLUCOPHAGE) 500 MG tablet Take 500 mg by mouth daily with breakfast.      omeprazole (PRILOSEC) 20 MG capsule Take 20 mg by mouth daily.      Oxycodone HCl 10 MG TABS Take 10 mg by mouth 5 (five) times daily as needed (pain).      phentermine (ADIPEX-P) 37.5 MG tablet Take 37.5 mg by mouth every morning.      pramipexole (MIRAPEX) 1 MG tablet Take 1 mg by mouth daily.      tadalafil (CIALIS) 20 MG tablet Take 20 mg by mouth daily.      tamsulosin (FLOMAX) 0.4 MG CAPS capsule Take 0.4 mg by mouth daily.      Vitamin D, Ergocalciferol, (DRISDOL) 1.25 MG (50000 UNIT) CAPS capsule Take 50,000 Units by mouth every Monday.      oxyCODONE (ROXICODONE) 15  MG immediate release tablet Take 15 mg by mouth 5 (five) times daily.      Allergies  Allergen Reactions   Bee Venom     Dizziness   Codeine Nausea And Vomiting     Legacy System: CCA Onset Date: <blank> Substance Legacy/Cerner: codeine / codeine (Legacy value) Category: Drug Severity Legacy/Cerner: <blank> / Unknown Reaction(s): <blank> Comments: <blank>    Nitroglycerin     Other reaction(s): Other (See Comments) severe headache  Legacy System: CCA Onset Date: <blank> Substance Legacy/Cerner: nitroglycerin / nitroglycerin (Legacy value) Category: Drug Severity Legacy/Cerner: <blank> / Unknown Reaction(s): severe headache Comments: <blank>     Social History   Tobacco Use   Smoking status: Every Day    Packs/day: 0.25    Types:  Cigarettes   Smokeless tobacco: Never  Substance Use Topics   Alcohol use: Yes    Comment: rare    No family history on file.   Review of Systems  Musculoskeletal:  Positive for gait problem.  All other systems reviewed and are negative.  Objective:  Physical Exam Vitals reviewed.  Constitutional:      Appearance: Normal appearance.  HENT:     Head: Normocephalic and atraumatic.  Eyes:     Extraocular Movements: Extraocular movements intact.     Pupils: Pupils are equal, round, and reactive to light.  Cardiovascular:     Rate and Rhythm: Normal rate.  Pulmonary:     Effort: Pulmonary effort is normal.     Breath sounds: Normal breath sounds.  Abdominal:     Palpations: Abdomen is soft.  Musculoskeletal:     Cervical back: Normal range of motion and neck supple.     Left hip: Tenderness and bony tenderness present. Decreased range of motion. Decreased strength.  Neurological:     Mental Status: He is alert and oriented to person, place, and time.  Psychiatric:        Behavior: Behavior normal.    Vital signs in last 24 hours:    Labs:   Estimated body mass index is 30.68 kg/m as calculated from the following:   Height as of 11/22/20: 5\' 11"  (1.803 m).   Weight as of 11/22/20: 99.8 kg.   Imaging Review Plain radiographs demonstrate severe degenerative joint disease of the left hip(s). The bone quality appears to be good for age and reported activity level.      Assessment/Plan:  End stage arthritis, left hip(s)  The patient history, physical examination, clinical judgement of the provider and imaging studies are consistent with end stage degenerative joint disease of the left hip(s) and total hip arthroplasty is deemed medically necessary. The treatment options including medical management, injection therapy, arthroscopy and arthroplasty were discussed at length. The risks and benefits of total hip arthroplasty were presented and reviewed. The risks due to  aseptic loosening, infection, stiffness, dislocation/subluxation,  thromboembolic complications and other imponderables were discussed.  The patient acknowledged the explanation, agreed to proceed with the plan and consent was signed. Patient is being admitted for inpatient treatment for surgery, pain control, PT, OT, prophylactic antibiotics, VTE prophylaxis, progressive ambulation and ADL's and discharge planning.The patient is planning to be discharged home with home health services

## 2020-11-25 ENCOUNTER — Other Ambulatory Visit: Payer: Self-pay

## 2020-11-25 ENCOUNTER — Ambulatory Visit (HOSPITAL_COMMUNITY): Payer: Medicare PPO | Admitting: Certified Registered"

## 2020-11-25 ENCOUNTER — Encounter (HOSPITAL_COMMUNITY)
Admission: RE | Disposition: A | Discharge status: Home / Self Care | Payer: Self-pay | Source: Home / Self Care | Attending: Orthopaedic Surgery

## 2020-11-25 ENCOUNTER — Observation Stay (HOSPITAL_COMMUNITY): Payer: Medicare PPO

## 2020-11-25 ENCOUNTER — Ambulatory Visit (HOSPITAL_COMMUNITY): Payer: Medicare PPO

## 2020-11-25 ENCOUNTER — Encounter (HOSPITAL_COMMUNITY): Payer: Self-pay | Admitting: Orthopaedic Surgery

## 2020-11-25 ENCOUNTER — Observation Stay (HOSPITAL_COMMUNITY)
Admission: RE | Admit: 2020-11-25 | Discharge: 2020-11-26 | Disposition: A | Discharge status: Home / Self Care | Payer: Medicare PPO | Attending: Orthopaedic Surgery | Admitting: Orthopaedic Surgery

## 2020-11-25 DIAGNOSIS — Z20822 Contact with and (suspected) exposure to covid-19: Secondary | ICD-10-CM | POA: Insufficient documentation

## 2020-11-25 DIAGNOSIS — Z7982 Long term (current) use of aspirin: Secondary | ICD-10-CM | POA: Insufficient documentation

## 2020-11-25 DIAGNOSIS — Z7902 Long term (current) use of antithrombotics/antiplatelets: Secondary | ICD-10-CM | POA: Insufficient documentation

## 2020-11-25 DIAGNOSIS — F17210 Nicotine dependence, cigarettes, uncomplicated: Secondary | ICD-10-CM | POA: Insufficient documentation

## 2020-11-25 DIAGNOSIS — Z96642 Presence of left artificial hip joint: Secondary | ICD-10-CM

## 2020-11-25 DIAGNOSIS — Z79899 Other long term (current) drug therapy: Secondary | ICD-10-CM | POA: Diagnosis not present

## 2020-11-25 DIAGNOSIS — I10 Essential (primary) hypertension: Secondary | ICD-10-CM | POA: Insufficient documentation

## 2020-11-25 DIAGNOSIS — M1612 Unilateral primary osteoarthritis, left hip: Secondary | ICD-10-CM | POA: Diagnosis not present

## 2020-11-25 DIAGNOSIS — Z419 Encounter for procedure for purposes other than remedying health state, unspecified: Secondary | ICD-10-CM

## 2020-11-25 HISTORY — PX: TOTAL HIP ARTHROPLASTY: SHX124

## 2020-11-25 LAB — TYPE AND SCREEN
ABO/RH(D): O POS
ABO/RH(D): O POS
Antibody Screen: NEGATIVE
Antibody Screen: NEGATIVE

## 2020-11-25 LAB — SARS CORONAVIRUS 2 BY RT PCR (HOSPITAL ORDER, PERFORMED IN ~~LOC~~ HOSPITAL LAB): SARS Coronavirus 2: NEGATIVE

## 2020-11-25 SURGERY — ARTHROPLASTY, HIP, TOTAL, ANTERIOR APPROACH
Anesthesia: Spinal | Site: Hip | Laterality: Left

## 2020-11-25 MED ORDER — PHENTERMINE HCL 37.5 MG PO TABS: 37.5000 mg | ORAL_TABLET | ORAL | Status: DC

## 2020-11-25 MED ORDER — METHOCARBAMOL 1000 MG/10ML IJ SOLN
500.0000 mg | Freq: Four times a day (QID) | INTRAVENOUS | Status: DC | PRN
  Filled 2020-11-25: qty 5

## 2020-11-25 MED ORDER — PHENYLEPHRINE HCL (PRESSORS) 10 MG/ML IV SOLN
INTRAVENOUS | Status: DC | PRN
  Administered 2020-11-25 (×7): 40 ug via INTRAVENOUS

## 2020-11-25 MED ORDER — FENTANYL CITRATE (PF) 100 MCG/2ML IJ SOLN
25.0000 ug | INTRAMUSCULAR | Status: DC | PRN
  Administered 2020-11-25 (×2): 50 ug via INTRAVENOUS

## 2020-11-25 MED ORDER — PHENYLEPHRINE 40 MCG/ML (10ML) SYRINGE FOR IV PUSH (FOR BLOOD PRESSURE SUPPORT)
PREFILLED_SYRINGE | INTRAVENOUS | Status: DC | PRN
  Administered 2020-11-25: 80 ug via INTRAVENOUS

## 2020-11-25 MED ORDER — TAMSULOSIN HCL 0.4 MG PO CAPS
0.4000 mg | ORAL_CAPSULE | Freq: Every day | ORAL | Status: DC
  Administered 2020-11-26: 0.4 mg via ORAL
  Filled 2020-11-25: qty 1

## 2020-11-25 MED ORDER — DIPHENHYDRAMINE HCL 12.5 MG/5ML PO ELIX
12.5000 mg | ORAL_SOLUTION | ORAL | Status: DC | PRN
  Administered 2020-11-26: 25 mg via ORAL
  Filled 2020-11-25: qty 10

## 2020-11-25 MED ORDER — MAGNESIUM OXIDE -MG SUPPLEMENT 400 (240 MG) MG PO TABS
400.0000 mg | ORAL_TABLET | Freq: Every day | ORAL | Status: DC
  Administered 2020-11-26: 400 mg via ORAL
  Filled 2020-11-25: qty 1

## 2020-11-25 MED ORDER — PRAMIPEXOLE DIHYDROCHLORIDE 0.25 MG PO TABS
1.0000 mg | ORAL_TABLET | Freq: Every day | ORAL | Status: DC
  Administered 2020-11-26: 1 mg via ORAL
  Filled 2020-11-25: qty 4

## 2020-11-25 MED ORDER — TRANEXAMIC ACID-NACL 1000-0.7 MG/100ML-% IV SOLN
1000.0000 mg | INTRAVENOUS | Status: AC
  Administered 2020-11-25: 1000 mg via INTRAVENOUS
  Filled 2020-11-25: qty 100

## 2020-11-25 MED ORDER — FENTANYL CITRATE (PF) 100 MCG/2ML IJ SOLN
INTRAMUSCULAR | Status: AC
  Filled 2020-11-25: qty 2

## 2020-11-25 MED ORDER — PROPOFOL 500 MG/50ML IV EMUL
INTRAVENOUS | Status: DC | PRN
  Administered 2020-11-25: 50 ug/kg/min via INTRAVENOUS

## 2020-11-25 MED ORDER — CHLORHEXIDINE GLUCONATE 0.12 % MT SOLN
15.0000 mL | Freq: Once | OROMUCOSAL | Status: AC
  Administered 2020-11-25: 15 mL via OROMUCOSAL

## 2020-11-25 MED ORDER — ATORVASTATIN CALCIUM 40 MG PO TABS
40.0000 mg | ORAL_TABLET | Freq: Every day | ORAL | Status: DC
  Administered 2020-11-26: 40 mg via ORAL
  Filled 2020-11-25: qty 1

## 2020-11-25 MED ORDER — DOCUSATE SODIUM 100 MG PO CAPS
100.0000 mg | ORAL_CAPSULE | Freq: Two times a day (BID) | ORAL | Status: DC
  Administered 2020-11-25 – 2020-11-26 (×2): 100 mg via ORAL
  Filled 2020-11-25 (×2): qty 1

## 2020-11-25 MED ORDER — LIDOCAINE 2% (20 MG/ML) 5 ML SYRINGE
INTRAMUSCULAR | Status: AC
  Filled 2020-11-25: qty 5

## 2020-11-25 MED ORDER — ONDANSETRON HCL 4 MG/2ML IJ SOLN
INTRAMUSCULAR | Status: DC | PRN
Start: 2020-11-25 — End: 2020-11-25
  Administered 2020-11-25: 4 mg via INTRAVENOUS

## 2020-11-25 MED ORDER — 0.9 % SODIUM CHLORIDE (POUR BTL) OPTIME
TOPICAL | Status: DC | PRN
  Administered 2020-11-25: 1000 mL

## 2020-11-25 MED ORDER — MIDAZOLAM HCL 5 MG/5ML IJ SOLN
INTRAMUSCULAR | Status: DC | PRN
  Administered 2020-11-25: 1 mg via INTRAVENOUS
  Administered 2020-11-25 (×2): .5 mg via INTRAVENOUS

## 2020-11-25 MED ORDER — ACETAMINOPHEN 325 MG PO TABS: 325.0000 mg | ORAL_TABLET | Freq: Four times a day (QID) | ORAL | Status: DC | PRN

## 2020-11-25 MED ORDER — ACETAMINOPHEN 500 MG PO TABS
1000.0000 mg | ORAL_TABLET | Freq: Once | ORAL | Status: AC
  Administered 2020-11-25: 1000 mg via ORAL
  Filled 2020-11-25: qty 2

## 2020-11-25 MED ORDER — ONDANSETRON HCL 4 MG PO TABS: 4.0000 mg | ORAL_TABLET | Freq: Four times a day (QID) | ORAL | Status: DC | PRN

## 2020-11-25 MED ORDER — CLOPIDOGREL BISULFATE 75 MG PO TABS
75.0000 mg | ORAL_TABLET | Freq: Every day | ORAL | Status: DC
  Administered 2020-11-26: 75 mg via ORAL
  Filled 2020-11-25: qty 1

## 2020-11-25 MED ORDER — DEXAMETHASONE SODIUM PHOSPHATE 4 MG/ML IJ SOLN
INTRAMUSCULAR | Status: DC | PRN
  Administered 2020-11-25: 5 mg via INTRAVENOUS

## 2020-11-25 MED ORDER — METHOCARBAMOL 500 MG PO TABS: 500.0000 mg | ORAL_TABLET | Freq: Four times a day (QID) | ORAL | Status: DC | PRN

## 2020-11-25 MED ORDER — METOCLOPRAMIDE HCL 5 MG/ML IJ SOLN: 5.0000 mg | Freq: Three times a day (TID) | INTRAMUSCULAR | Status: DC | PRN

## 2020-11-25 MED ORDER — SODIUM CHLORIDE 0.9 % IV SOLN: INTRAVENOUS | Status: DC

## 2020-11-25 MED ORDER — ONDANSETRON HCL 4 MG/2ML IJ SOLN: 4.0000 mg | Freq: Four times a day (QID) | INTRAMUSCULAR | Status: DC | PRN

## 2020-11-25 MED ORDER — OXYCODONE HCL 5 MG PO TABS
5.0000 mg | ORAL_TABLET | ORAL | Status: DC | PRN
  Administered 2020-11-25 – 2020-11-26 (×2): 10 mg via ORAL
  Filled 2020-11-25 (×3): qty 2

## 2020-11-25 MED ORDER — ASPIRIN EC 81 MG PO TBEC
81.0000 mg | DELAYED_RELEASE_TABLET | Freq: Every day | ORAL | Status: DC
  Administered 2020-11-26: 81 mg via ORAL
  Filled 2020-11-25: qty 1

## 2020-11-25 MED ORDER — LACTATED RINGERS IV SOLN: INTRAVENOUS | Status: DC

## 2020-11-25 MED ORDER — PANTOPRAZOLE SODIUM 40 MG PO TBEC
40.0000 mg | DELAYED_RELEASE_TABLET | Freq: Every day | ORAL | Status: DC
  Administered 2020-11-26: 40 mg via ORAL
  Filled 2020-11-25: qty 1

## 2020-11-25 MED ORDER — MIDAZOLAM HCL 2 MG/2ML IJ SOLN
INTRAMUSCULAR | Status: AC
  Filled 2020-11-25: qty 2

## 2020-11-25 MED ORDER — EPHEDRINE 5 MG/ML INJ
INTRAVENOUS | Status: AC
  Filled 2020-11-25: qty 5

## 2020-11-25 MED ORDER — ALUM & MAG HYDROXIDE-SIMETH 200-200-20 MG/5ML PO SUSP: 30.0000 mL | ORAL | Status: DC | PRN

## 2020-11-25 MED ORDER — PHENYLEPHRINE 40 MCG/ML (10ML) SYRINGE FOR IV PUSH (FOR BLOOD PRESSURE SUPPORT)
PREFILLED_SYRINGE | INTRAVENOUS | Status: AC
  Filled 2020-11-25: qty 10

## 2020-11-25 MED ORDER — EPHEDRINE SULFATE 50 MG/ML IJ SOLN: INTRAMUSCULAR | Status: DC | PRN

## 2020-11-25 MED ORDER — ONDANSETRON HCL 4 MG/2ML IJ SOLN: 4.0000 mg | Freq: Once | INTRAMUSCULAR | Status: DC | PRN

## 2020-11-25 MED ORDER — VANCOMYCIN HCL IN DEXTROSE 1-5 GM/200ML-% IV SOLN
1000.0000 mg | INTRAVENOUS | Status: AC
  Administered 2020-11-25: 1000 mg via INTRAVENOUS
  Filled 2020-11-25: qty 200

## 2020-11-25 MED ORDER — CEFAZOLIN SODIUM-DEXTROSE 1-4 GM/50ML-% IV SOLN
1.0000 g | Freq: Four times a day (QID) | INTRAVENOUS | Status: AC
  Administered 2020-11-25 (×2): 1 g via INTRAVENOUS
  Filled 2020-11-25 (×2): qty 50

## 2020-11-25 MED ORDER — OXYCODONE HCL 5 MG PO TABS
10.0000 mg | ORAL_TABLET | ORAL | Status: DC | PRN
  Administered 2020-11-25: 10 mg via ORAL
  Administered 2020-11-26: 15 mg via ORAL
  Filled 2020-11-25: qty 3

## 2020-11-25 MED ORDER — ASCORBIC ACID 500 MG PO TABS
2000.0000 mg | ORAL_TABLET | Freq: Every day | ORAL | Status: DC
  Administered 2020-11-26: 2000 mg via ORAL
  Filled 2020-11-25: qty 4

## 2020-11-25 MED ORDER — METFORMIN HCL 500 MG PO TABS
500.0000 mg | ORAL_TABLET | Freq: Every day | ORAL | Status: DC
  Administered 2020-11-26: 500 mg via ORAL
  Filled 2020-11-25: qty 1

## 2020-11-25 MED ORDER — EPHEDRINE SULFATE-NACL 50-0.9 MG/10ML-% IV SOSY
PREFILLED_SYRINGE | INTRAVENOUS | Status: DC | PRN
  Administered 2020-11-25 (×5): 10 mg via INTRAVENOUS

## 2020-11-25 MED ORDER — ORAL CARE MOUTH RINSE: 15.0000 mL | Freq: Once | OROMUCOSAL | Status: AC

## 2020-11-25 MED ORDER — PHENYLEPHRINE HCL (PRESSORS) 10 MG/ML IV SOLN
INTRAVENOUS | Status: AC
  Filled 2020-11-25: qty 1

## 2020-11-25 MED ORDER — HYDROCHLOROTHIAZIDE 25 MG PO TABS
25.0000 mg | ORAL_TABLET | Freq: Every day | ORAL | Status: DC
  Administered 2020-11-25 – 2020-11-26 (×2): 25 mg via ORAL
  Filled 2020-11-25 (×2): qty 1

## 2020-11-25 MED ORDER — VITAMIN D (ERGOCALCIFEROL) 1.25 MG (50000 UNIT) PO CAPS: 50000.0000 [IU] | ORAL_CAPSULE | ORAL | Status: DC

## 2020-11-25 MED ORDER — ONDANSETRON HCL 4 MG/2ML IJ SOLN
INTRAMUSCULAR | Status: AC
  Filled 2020-11-25: qty 2

## 2020-11-25 MED ORDER — SODIUM CHLORIDE (PF) 0.9 % IJ SOLN
INTRAMUSCULAR | Status: AC
  Filled 2020-11-25: qty 10

## 2020-11-25 MED ORDER — PHENOL 1.4 % MT LIQD: 1.0000 | OROMUCOSAL | Status: DC | PRN

## 2020-11-25 MED ORDER — SODIUM CHLORIDE 0.9 % IR SOLN
Status: DC | PRN
  Administered 2020-11-25: 1000 mL

## 2020-11-25 MED ORDER — DEXAMETHASONE SODIUM PHOSPHATE 10 MG/ML IJ SOLN
INTRAMUSCULAR | Status: AC
  Filled 2020-11-25: qty 1

## 2020-11-25 MED ORDER — METOCLOPRAMIDE HCL 5 MG PO TABS: 5.0000 mg | ORAL_TABLET | Freq: Three times a day (TID) | ORAL | Status: DC | PRN

## 2020-11-25 MED ORDER — HYDROMORPHONE HCL 1 MG/ML IJ SOLN
0.5000 mg | INTRAMUSCULAR | Status: DC | PRN
  Administered 2020-11-25: 1 mg via INTRAVENOUS
  Administered 2020-11-25: 0.5 mg via INTRAVENOUS
  Filled 2020-11-25 (×2): qty 1

## 2020-11-25 MED ORDER — STERILE WATER FOR IRRIGATION IR SOLN
Status: DC | PRN
  Administered 2020-11-25: 2000 mL

## 2020-11-25 MED ORDER — POVIDONE-IODINE 10 % EX SWAB
2.0000 "application " | Freq: Once | CUTANEOUS | Status: AC
  Administered 2020-11-25: 2 via TOPICAL

## 2020-11-25 MED ORDER — FUROSEMIDE 40 MG PO TABS
40.0000 mg | ORAL_TABLET | Freq: Every day | ORAL | Status: DC
  Administered 2020-11-25 – 2020-11-26 (×2): 40 mg via ORAL
  Filled 2020-11-25 (×2): qty 1

## 2020-11-25 MED ORDER — CEFAZOLIN SODIUM-DEXTROSE 2-4 GM/100ML-% IV SOLN
2.0000 g | INTRAVENOUS | Status: AC
  Administered 2020-11-25: 2 g via INTRAVENOUS
  Filled 2020-11-25: qty 100

## 2020-11-25 MED ORDER — ATENOLOL 50 MG PO TABS
100.0000 mg | ORAL_TABLET | Freq: Every day | ORAL | Status: DC
  Administered 2020-11-26: 100 mg via ORAL
  Filled 2020-11-25: qty 2

## 2020-11-25 MED ORDER — PROPOFOL 1000 MG/100ML IV EMUL
INTRAVENOUS | Status: AC
  Filled 2020-11-25: qty 100

## 2020-11-25 MED ORDER — MENTHOL 3 MG MT LOZG: 1.0000 | LOZENGE | OROMUCOSAL | Status: DC | PRN

## 2020-11-25 SURGICAL SUPPLY — 45 items
BAG COUNTER SPONGE SURGICOUNT (BAG) ×2 IMPLANT
BAG SURGICOUNT SPONGE COUNTING (BAG) ×1
BAG ZIPLOCK 12X15 (MISCELLANEOUS) IMPLANT
BENZOIN TINCTURE PRP APPL 2/3 (GAUZE/BANDAGES/DRESSINGS) IMPLANT
BLADE SAW SGTL 18X1.27X75 (BLADE) ×2 IMPLANT
BLADE SAW SGTL 18X1.27X75MM (BLADE) ×1
CLOSURE WOUND 1/2 X4 (GAUZE/BANDAGES/DRESSINGS)
COVER PERINEAL POST (MISCELLANEOUS) ×3 IMPLANT
COVER SURGICAL LIGHT HANDLE (MISCELLANEOUS) ×3 IMPLANT
CUP ACET PNNCL SECTR W/GRIP 56 (Hips) ×1 IMPLANT
DRAPE FOOT SWITCH (DRAPES) ×3 IMPLANT
DRAPE STERI IOBAN 125X83 (DRAPES) ×3 IMPLANT
DRAPE U-SHAPE 47X51 STRL (DRAPES) ×6 IMPLANT
DRESSING AQUACEL AG SP 3.5X10 (GAUZE/BANDAGES/DRESSINGS) ×1 IMPLANT
DRSG AQUACEL AG ADV 3.5X10 (GAUZE/BANDAGES/DRESSINGS) ×3 IMPLANT
DRSG AQUACEL AG SP 3.5X10 (GAUZE/BANDAGES/DRESSINGS) ×3
DURAPREP 26ML APPLICATOR (WOUND CARE) ×3 IMPLANT
ELECT REM PT RETURN 15FT ADLT (MISCELLANEOUS) ×3 IMPLANT
GAUZE XEROFORM 1X8 LF (GAUZE/BANDAGES/DRESSINGS) IMPLANT
GLOVE SRG 8 PF TXTR STRL LF DI (GLOVE) ×2 IMPLANT
GLOVE SURG ENC MOIS LTX SZ7.5 (GLOVE) ×3 IMPLANT
GLOVE SURG NEOPR MICRO LF SZ8 (GLOVE) ×3 IMPLANT
GLOVE SURG UNDER POLY LF SZ8 (GLOVE) ×4
GOWN STRL REUS W/TWL XL LVL3 (GOWN DISPOSABLE) ×6 IMPLANT
HANDPIECE INTERPULSE COAX TIP (DISPOSABLE) ×2
HEAD CERAMIC 36 PLUS 8.5 12 14 (Hips) ×3 IMPLANT
HOLDER FOLEY CATH W/STRAP (MISCELLANEOUS) ×3 IMPLANT
KIT TURNOVER KIT A (KITS) ×3 IMPLANT
PACK ANTERIOR HIP CUSTOM (KITS) ×3 IMPLANT
PENCIL SMOKE EVACUATOR (MISCELLANEOUS) IMPLANT
PINN SECTOR W/GRIP ACE CUP 56 (Hips) ×3 IMPLANT
PINNACLE ALTRX PLUS 4 N 36X56 (Hips) ×3 IMPLANT
SET HNDPC FAN SPRY TIP SCT (DISPOSABLE) ×1 IMPLANT
SPONGE T-LAP 18X18 ~~LOC~~+RFID (SPONGE) ×6 IMPLANT
STAPLER VISISTAT 35W (STAPLE) ×3 IMPLANT
STEM FEMORAL SZ6 HIGH ACTIS (Stem) ×3 IMPLANT
STRIP CLOSURE SKIN 1/2X4 (GAUZE/BANDAGES/DRESSINGS) IMPLANT
SUT ETHIBOND NAB CT1 #1 30IN (SUTURE) ×3 IMPLANT
SUT ETHILON 2 0 PS N (SUTURE) IMPLANT
SUT MNCRL AB 4-0 PS2 18 (SUTURE) IMPLANT
SUT VIC AB 0 CT1 36 (SUTURE) ×3 IMPLANT
SUT VIC AB 1 CT1 36 (SUTURE) ×3 IMPLANT
SUT VIC AB 2-0 CT1 27 (SUTURE) ×4
SUT VIC AB 2-0 CT1 TAPERPNT 27 (SUTURE) ×2 IMPLANT
TRAY FOLEY MTR SLVR 16FR STAT (SET/KITS/TRAYS/PACK) IMPLANT

## 2020-11-25 NOTE — Plan of Care (Signed)
  Problem: Pain Managment: Goal: General experience of comfort will improve Outcome: Progressing   Problem: Activity: Goal: Risk for activity intolerance will decrease Outcome: Progressing   

## 2020-11-25 NOTE — Brief Op Note (Signed)
11/25/2020  11:11 AM  PATIENT:  Samuel Dougherty  70 y.o. male  PRE-OPERATIVE DIAGNOSIS:  osteoarthritis left hip  POST-OPERATIVE DIAGNOSIS:  osteoarthritis left hip  PROCEDURE:  Procedure(s): LEFT TOTAL HIP ARTHROPLASTY ANTERIOR APPROACH (Left)  SURGEON:  Surgeon(s) and Role:    Kathryne Hitch, MD - Primary  PHYSICIAN ASSISTANT: Rexene Edison, PA-C   ANESTHESIA:   spinal  EBL:  200 mL   COUNTS:  YES  DICTATION: .Other Dictation: Dictation Number [id number]  PLAN OF CARE: Admit for overnight observation  PATIENT DISPOSITION:  PACU - hemodynamically stable.   Delay start of Pharmacological VTE agent (>24hrs) due to surgical blood loss or risk of bleeding: no

## 2020-11-25 NOTE — Transfer of Care (Signed)
Immediate Anesthesia Transfer of Care Note  Patient: Samuel Dougherty  Procedure(s) Performed: LEFT TOTAL HIP ARTHROPLASTY ANTERIOR APPROACH (Left: Hip)  Patient Location: PACU  Anesthesia Type:Spinal  Level of Consciousness: drowsy and patient cooperative  Airway & Oxygen Therapy: Patient Spontanous Breathing and Patient connected to face mask oxygen  Post-op Assessment: Report given to RN and Post -op Vital signs reviewed and stable  Post vital signs: Reviewed and stable  Last Vitals:  Vitals Value Taken Time  BP 108/62 11/25/20 1130  Temp    Pulse 64 11/25/20 1132  Resp 12 11/25/20 1132  SpO2 98 % 11/25/20 1132  Vitals shown include unvalidated device data.  Last Pain:  Vitals:   11/25/20 0729  TempSrc:   PainSc: 0-No pain      Patients Stated Pain Goal: 4 (11/25/20 0729)  Complications: No notable events documented.

## 2020-11-25 NOTE — Interval H&P Note (Signed)
History and Physical Interval Note: The patient understands that he is here today for a left total hip replacement to treat his left hip osteoarthritis.  There has been no acute or interval change in his medical status.  See recent H&P.  The risks and benefits of surgery have been explained in detail and informed consent is obtained.  The left hip has been marked.  11/25/2020 8:36 AM  Samuel Dougherty  has presented today for surgery, with the diagnosis of osteoarthritis left hip.  The various methods of treatment have been discussed with the patient and family. After consideration of risks, benefits and other options for treatment, the patient has consented to  Procedure(s): LEFT TOTAL HIP ARTHROPLASTY ANTERIOR APPROACH (Left) as a surgical intervention.  The patient's history has been reviewed, patient examined, no change in status, stable for surgery.  I have reviewed the patient's chart and labs.  Questions were answered to the patient's satisfaction.     Kathryne Hitch

## 2020-11-25 NOTE — Anesthesia Preprocedure Evaluation (Addendum)
Anesthesia Evaluation  Patient identified by MRN, date of birth, ID band Patient awake    Reviewed: Allergy & Precautions, NPO status , Patient's Chart, lab work & pertinent test results, reviewed documented beta blocker date and time   Airway Mallampati: III  TM Distance: >3 FB Neck ROM: Full    Dental  (+) Teeth Intact, Dental Advisory Given   Pulmonary sleep apnea and Continuous Positive Airway Pressure Ventilation , Current Smoker and Patient abstained from smoking.,    Pulmonary exam normal breath sounds clear to auscultation       Cardiovascular hypertension, Pt. on home beta blockers + Peripheral Vascular Disease and + DVT  Normal cardiovascular exam Rhythm:Regular Rate:Normal     Neuro/Psych negative neurological ROS  negative psych ROS   GI/Hepatic Neg liver ROS, GERD  Medicated,  Endo/Other  Obesity   Renal/GU negative Renal ROS     Musculoskeletal  (+) Arthritis , osteoarthritis left hip   Abdominal   Peds  Hematology  (+) Blood dyscrasia (Plavix), , Plt 270k   Anesthesia Other Findings Day of surgery medications reviewed with the patient.  Reproductive/Obstetrics                            Anesthesia Physical Anesthesia Plan  ASA: 3  Anesthesia Plan: Spinal   Post-op Pain Management:    Induction: Intravenous  PONV Risk Score and Plan: 0 and Propofol infusion, Treatment may vary due to age or medical condition, Midazolam, Dexamethasone and Ondansetron  Airway Management Planned: Natural Airway and Nasal Cannula  Additional Equipment:   Intra-op Plan:   Post-operative Plan:   Informed Consent: I have reviewed the patients History and Physical, chart, labs and discussed the procedure including the risks, benefits and alternatives for the proposed anesthesia with the patient or authorized representative who has indicated his/her understanding and acceptance.      Dental advisory given  Plan Discussed with: CRNA, Anesthesiologist and Surgeon  Anesthesia Plan Comments:       Anesthesia Quick Evaluation

## 2020-11-25 NOTE — Evaluation (Signed)
Physical Therapy Evaluation Patient Details Name: Samuel Dougherty MRN: [id number] DOB: 08-08-1950 Today's Date: 11/25/2020   History of Present Illness  Patient is 70 y.o. male s/p Lt THA anterior approach on 11/25/20 with PMH significant for HTN, GERD, OA.  Clinical Impression  Samuel Dougherty is a 69 y.o. male POD 0 s/p Lt THA. Patient reports independence with mobility at baseline. Patient is now limited by functional impairments (see PT problem list below) and requires min assist for transfers and gait with RW. Patient was able to ambulate ~65 feet with RW and min assist. Patient instructed in exercise to facilitate ROM and circulation to manage edema and reduce risk of DVT. Patient will benefit from continued skilled PT interventions to address impairments and progress towards PLOF. Acute PT will follow to progress mobility and stair training in preparation for safe discharge home.     Follow Up Recommendations Follow surgeon's recommendation for DC plan and follow-up therapies;Home health PT    Equipment Recommendations  Rolling walker with 5" wheels;3in1 (PT)    Recommendations for Other Services       Precautions / Restrictions Precautions Precautions: Fall Restrictions Weight Bearing Restrictions: No Other Position/Activity Restrictions: WBAT      Mobility  Bed Mobility Overal bed mobility: Needs Assistance Bed Mobility: Supine to Sit     Supine to sit: Min assist;HOB elevated     General bed mobility comments: cues to use bed rail to pivot to EOB, assist to bring Lt LE off side.    Transfers Overall transfer level: Needs assistance Equipment used: Rolling walker (2 wheeled) Transfers: Sit to/from Stand Sit to Stand: Min assist         General transfer comment: cues for hand placement for power up, assist to steady with rise to walker.  Ambulation/Gait Ambulation/Gait assistance: Min assist;Min guard Gait Distance (Feet): 65 Feet Assistive device: Rolling  walker (2 wheeled) Gait Pattern/deviations: Step-to pattern;Decreased stride length;Decreased stance time - left;Decreased weight shift to left     General Gait Details: cues for step pattern and proximity to RW, pt steady and progressed to min guard.  Stairs            Wheelchair Mobility    Modified Rankin (Stroke Patients Only)       Balance Overall balance assessment: Needs assistance Sitting-balance support: Feet supported Sitting balance-Leahy Scale: Good     Standing balance support: Bilateral upper extremity supported;During functional activity Standing balance-Leahy Scale: Poor                               Pertinent Vitals/Pain Pain Assessment: Faces Faces Pain Scale: Hurts little more Pain Location: Lt hip Pain Descriptors / Indicators: Aching;Discomfort Pain Intervention(s): Limited activity within patient's tolerance;Monitored during session;Premedicated before session;Repositioned    Home Living Family/patient expects to be discharged to:: Private residence Living Arrangements: Alone Available Help at Discharge: Family Type of Home: House Home Access: Stairs to enter Entrance Stairs-Rails: Can reach both Entrance Stairs-Number of Steps: 7 Home Layout: One level Home Equipment: Cane - single point;Walker - standard      Prior Function Level of Independence: Independent with assistive device(s)         Comments: pt using bil SPC for mobility     Hand Dominance   Dominant Hand: Right    Extremity/Trunk Assessment   Upper Extremity Assessment Upper Extremity Assessment: Overall WFL for tasks assessed    Lower Extremity Assessment Lower Extremity  Assessment: Overall WFL for tasks assessed    Cervical / Trunk Assessment Cervical / Trunk Assessment: Normal  Communication   Communication: No difficulties  Cognition Arousal/Alertness: Awake/alert Behavior During Therapy: WFL for tasks assessed/performed Overall Cognitive  Status: Within Functional Limits for tasks assessed                                        General Comments      Exercises Total Joint Exercises Ankle Circles/Pumps: AROM;Both;20 reps;Seated Quad Sets: AROM;Left;10 reps;Seated Heel Slides: AAROM;Left;10 reps;Seated   Assessment/Plan    PT Assessment Patient needs continued PT services  PT Problem List Decreased strength;Decreased range of motion;Decreased activity tolerance;Decreased balance;Decreased mobility;Decreased knowledge of use of DME;Decreased knowledge of precautions;Pain       PT Treatment Interventions DME instruction;Gait training;Stair training;Functional mobility training;Therapeutic activities;Therapeutic exercise;Balance training;Patient/family education    PT Goals (Current goals can be found in the Care Plan section)  Acute Rehab PT Goals Patient Stated Goal: get back to riding his motorcycle PT Goal Formulation: With patient Time For Goal Achievement: 12/02/20 Potential to Achieve Goals: Good    Frequency 7X/week   Barriers to discharge        Co-evaluation               AM-PAC PT "6 Clicks" Mobility  Outcome Measure Help needed turning from your back to your side while in a flat bed without using bedrails?: A Little Help needed moving from lying on your back to sitting on the side of a flat bed without using bedrails?: A Little Help needed moving to and from a bed to a chair (including a wheelchair)?: A Little Help needed standing up from a chair using your arms (e.g., wheelchair or bedside chair)?: A Little Help needed to walk in hospital room?: A Little Help needed climbing 3-5 steps with a railing? : A Little 6 Click Score: 18    End of Session Equipment Utilized During Treatment: Gait belt Activity Tolerance: Patient tolerated treatment well Patient left: in chair;with call bell/phone within reach;with chair alarm set;with family/visitor present Nurse Communication:  Mobility status PT Visit Diagnosis: Muscle weakness (generalized) (M62.81);Difficulty in walking, not elsewhere classified (R26.2)    Time: 4174-0814 PT Time Calculation (min) (ACUTE ONLY): 25 min   Charges:   PT Evaluation $PT Eval Low Complexity: 1 Low PT Treatments $Gait Training: 8-22 mins        Wynn Maudlin, DPT Acute Rehabilitation Services Office [phone] Pager [phone]   Anitra Lauth 11/25/2020, 5:54 PM

## 2020-11-25 NOTE — Anesthesia Postprocedure Evaluation (Signed)
Anesthesia Post Note  Patient: Samuel Dougherty  Procedure(s) Performed: LEFT TOTAL HIP ARTHROPLASTY ANTERIOR APPROACH (Left: Hip)     Patient location during evaluation: PACU Anesthesia Type: Spinal Level of consciousness: oriented, awake and alert and awake Pain management: pain level controlled Vital Signs Assessment: post-procedure vital signs reviewed and stable Respiratory status: spontaneous breathing, respiratory function stable and nonlabored ventilation Cardiovascular status: blood pressure returned to baseline and stable Postop Assessment: no headache, no backache, no apparent nausea or vomiting, spinal receding and patient able to bend at knees Anesthetic complications: no   No notable events documented.  Last Vitals:  Vitals:   11/25/20 1354 11/25/20 1617  BP: (!) 146/86 (!) 143/69  Pulse: 66 73  Resp: 16 16  Temp: 36.5 C 36.5 C  SpO2:  94%    Last Pain:  Vitals:   11/25/20 1820  TempSrc:   PainSc: 4                  Cecile Hearing

## 2020-11-25 NOTE — Discharge Instructions (Signed)

## 2020-11-25 NOTE — Anesthesia Procedure Notes (Signed)
Procedure Name: MAC Date/Time: 11/25/2020 10:10 AM Performed by: Justice Rocher, CRNA Pre-anesthesia Checklist: Timeout performed, Patient being monitored, Suction available, Emergency Drugs available and Patient identified Patient Re-evaluated:Patient Re-evaluated prior to induction Oxygen Delivery Method: Simple face mask Preoxygenation: Pre-oxygenation with 100% oxygen Induction Type: IV induction Placement Confirmation: breath sounds checked- equal and bilateral and positive ETCO2

## 2020-11-25 NOTE — Anesthesia Procedure Notes (Signed)
Spinal  Patient location during procedure: OR Start time: 11/25/2020 9:52 AM End time: 11/25/2020 9:57 AM Reason for block: surgical anesthesia Preanesthetic Checklist Completed: patient identified, IV checked, site marked, risks and benefits discussed, surgical consent, monitors and equipment checked, pre-op evaluation and timeout performed Spinal Block Patient position: sitting Prep: DuraPrep Patient monitoring: heart rate, cardiac monitor, continuous pulse ox and blood pressure Approach: midline Location: L3-4 Injection technique: single-shot Needle Needle type: Sprotte  Needle gauge: 24 G Needle length: 9 cm Assessment Sensory level: T4 Events: CSF return

## 2020-11-26 DIAGNOSIS — M1612 Unilateral primary osteoarthritis, left hip: Secondary | ICD-10-CM | POA: Diagnosis not present

## 2020-11-26 LAB — BASIC METABOLIC PANEL
Anion gap: 10 (ref 5–15)
BUN: 26 mg/dL — ABNORMAL HIGH (ref 8–23)
CO2: 27 mmol/L (ref 22–32)
Calcium: 9.1 mg/dL (ref 8.9–10.3)
Chloride: 98 mmol/L (ref 98–111)
Creatinine, Ser: 1.05 mg/dL (ref 0.61–1.24)
GFR, Estimated: >60 mL/min (ref >60)
Glucose, Bld: 148 mg/dL — ABNORMAL HIGH (ref 70–99)
Potassium: 3.4 mmol/L — ABNORMAL LOW (ref 3.5–5.1)
Sodium: 135 mmol/L (ref 135–145)

## 2020-11-26 LAB — CBC
HCT: 36.9 % — ABNORMAL LOW (ref 39.0–52.0)
Hemoglobin: 12.4 g/dL — ABNORMAL LOW (ref 13.0–17.0)
MCH: 32.4 pg (ref 26.0–34.0)
MCHC: 33.6 g/dL (ref 30.0–36.0)
MCV: 96.3 fL (ref 80.0–100.0)
Platelets: 222 10*3/uL (ref 150–400)
RBC: 3.83 MIL/uL — ABNORMAL LOW (ref 4.22–5.81)
RDW: 12.7 % (ref 11.5–15.5)
WBC: 14.6 10*3/uL — ABNORMAL HIGH (ref 4.0–10.5)
nRBC: 0 % (ref 0.0–0.2)

## 2020-11-26 MED ORDER — OXYCODONE HCL 5 MG PO TABS
15.0000 mg | ORAL_TABLET | ORAL | Status: DC
  Administered 2020-11-26: 15 mg via ORAL
  Filled 2020-11-26: qty 3

## 2020-11-26 MED ORDER — OXYCODONE HCL 5 MG PO TABS
20.0000 mg | ORAL_TABLET | Freq: Once | ORAL | Status: AC
Start: 2020-11-26 — End: 2020-11-26
  Administered 2020-11-26: 20 mg via ORAL
  Filled 2020-11-26: qty 4

## 2020-11-26 MED ORDER — OXYCODONE-ACETAMINOPHEN 5-325 MG PO TABS: 4.0000 | ORAL_TABLET | Freq: Once | ORAL | Status: DC

## 2020-11-26 NOTE — TOC Transition Note (Signed)
Transition of Care [facility]) - CM/SW Discharge Note   Patient Details  Name: Samuel Dougherty MRN: [id number] Date of Birth: [birth year]  [facility] CM/SW Contact:  Lennart Pall, LCSW Phone Number: 11/26/2020, 10:36 AM   Clinical Narrative:    Met with pt and confirming need for DME - order placed with [employer] for 3n1 and rolling walker for deliver to room.  Plan for HHPT via [place] (prearranged with [place] branch). No further TOC needs.   Final next level of care: [place] Barriers to Discharge: No Barriers Identified   Patient Goals and CMS Choice Patient states their goals for this hospitalization and ongoing recovery are:: return home      Discharge Placement                       Discharge Plan and Services                DME Arranged: 3-N-1, Walker rolling DME Agency: [employer] Date DME Agency Contacted: 11/26/20 Time DME Agency Contacted: 1000 Representative spoke with at DME Agency: [place]: PT [place]: [place] ([place] branch)        Social Determinants of Health (SDOH) Interventions     Readmission Risk Interventions No flowsheet data found.

## 2020-11-26 NOTE — Progress Notes (Signed)
Physical Therapy Treatment Patient Details Name: Samuel Dougherty MRN: [id number] DOB: 12-Nov-1950 Today's Date: 11/26/2020    History of Present Illness Patient is 70 y.o. male s/p Lt THA anterior approach on 11/25/20 with PMH significant for HTN, GERD, OA.    PT Comments    Progressing with mobility. Reviewed/practiced gait and stair training. Moderate pain with activity. Pt continues to express some concern about pain control. RN asked pt if he felt he needed to remain overnight. Pt stated that he will stick with plan of d/c home today because he feels he will be able to manage his pain better at home. Okay to d/c from PT standpoint. Defer to nursing for pain control concerns.     Follow Up Recommendations  Follow surgeon's recommendation for DC plan and follow-up therapies;Supervision for mobility/OO[facility] PT per chart)     Equipment Recommendations  Rolling walker with 5" wheels;3in1 (PT)    Recommendations for Other Services       Precautions / Restrictions Precautions Precautions: Fall Restrictions Weight Bearing Restrictions: No Other Position/Activity Restrictions: WBAT    Mobility  Bed Mobility Overal bed mobility: Needs Assistance Bed Mobility: Supine to Sit;Sit to Supine     Supine to sit: Min guard Sit to supine: Min assist   General bed mobility comments: Assist for L LE onto bed only. Increased time.    Transfers Overall transfer level: Needs assistance Equipment used: Rolling walker (2 wheeled) Transfers: Sit to/from Stand Sit to Stand: Min guard         General transfer comment: Min guard for safety. Cues for safety, technique, hand/LE placement. Increased time.  Ambulation/Gait Ambulation/Gait assistance: Min guard Gait Distance (Feet): 75 Feet Assistive device: Rolling walker (2 wheeled) Gait Pattern/deviations: Step-to pattern;Step-through pattern;Decreased stride length     General Gait Details: Progressed to step through pattern  as distance increased. No LOB with RW use.   Stairs Stairs: Yes Stairs assistance: Min guard Stair Management: Step to pattern;Forwards;Two rails Number of Stairs: 5 General stair comments: up and over portable stairs x 2. cues for safety, technique, sequence. min guard for safety   Wheelchair Mobility    Modified Rankin (Stroke Patients Only)       Balance Overall balance assessment: Needs assistance         Standing balance support: Bilateral upper extremity supported Standing balance-Leahy Scale: Fair                              Cognition Arousal/Alertness: Awake/alert Behavior During Therapy: WFL for tasks assessed/performed Overall Cognitive Status: Within Functional Limits for tasks assessed                                        Exercises Total Joint Exercises Ankle Circles/Pumps: AROM;Both;10 reps Quad Sets: AROM;Both;10 reps Heel Slides: AAROM;10 reps Hip ABduction/ADduction: AAROM;Left;10 reps    General Comments        Pertinent Vitals/Pain Pain Assessment: 0-10 Pain Score: 8  Pain Location: L hip/thigh Pain Descriptors / Indicators: Discomfort;Sore;Grimacing Pain Intervention(s): Limited activity within patient's tolerance;Monitored during session;Premedicated before session;Repositioned    Home Living                      Prior Function            PT Goals (current goals can now be  found in the care plan section) Progress towards PT goals: Progressing toward goals    Frequency    7X/week      PT Plan Current plan remains appropriate    Co-evaluation              AM-PAC PT "6 Clicks" Mobility   Outcome Measure  Help needed turning from your back to your side while in a flat bed without using bedrails?: A Little Help needed moving from lying on your back to sitting on the side of a flat bed without using bedrails?: A Little Help needed moving to and from a bed to a chair (including a  wheelchair)?: A Little Help needed standing up from a chair using your arms (e.g., wheelchair or bedside chair)?: A Little Help needed to walk in hospital room?: A Little Help needed climbing 3-5 steps with a railing? : A Little 6 Click Score: 18    End of Session Equipment Utilized During Treatment: Gait belt Activity Tolerance: Patient tolerated treatment well;Patient limited by pain Patient left: in bed;with call bell/phone within reach;with bed alarm set   PT Visit Diagnosis: Other abnormalities of gait and mobility (R26.89);Pain Pain - Right/Left: Left Pain - part of body: Hip     Time: 1450-1515 PT Time Calculation (min) (ACUTE ONLY): 25 min  Charges:  $Gait Training: 23-37 mins $Therapeutic Exercise: 8-22 mins                         Faye Ramsay, PT Acute Rehabilitation  Office: [phone] Pager: [phone]

## 2020-11-26 NOTE — Op Note (Signed)
Samuel Dougherty, Samuel Dougherty MEDICAL RECORD NO: [id number] ACCOUNT NO: [id number] DATE OF BIRTH: 03/12/1951 FACILITY: [facility] LOCATION: [id number] PHYSICIAN: Vanita Panda. Magnus Ivan, MD  Operative Report   DATE OF PROCEDURE: 11/25/2020  PREOPERATIVE DIAGNOSIS:  Primary osteoarthritis and degenerative joint disease, left hip.  POSTOPERATIVE DIAGNOSIS:  Primary osteoarthritis and degenerative joint disease, left hip.  PROCEDURE:  Left total hip arthroplasty through direct anterior approach.  IMPLANTS:  DePuy sector Gription acetabular component size 56, size 36+4 neutral polyethylene liner, size 6 ACTIS femoral component with high offset, size 36+8.5 ceramic hip ball.  SURGEON:  Vanita Panda. Magnus Ivan, MD  ASSISTANT:  Richardean Canal, PA-C  ANESTHESIA:  Spinal.  ANTIBIOTICS:  2 g IV Ancef.  ESTIMATED BLOOD LOSS:  200-250 mL  COMPLICATIONS:  None.  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  This has been well documented with clinical exam and x-rays.  He is actually shorter on the left side as well.  His left hip pain is  detrimentally affecting his mobility, his quality of life and his activities of daily living to the point he does wish to proceed with a total hip arthroplasty on this left side and we do agree with this and recommended as well.  With surgery, he  understands there is risk of acute blood loss anemia, nerve or vessel injury, fracture, infection, dislocation, DVT, implant failure and skin and soft tissue issues.  He understands our goals are to decrease pain, improve mobility and overall improve  quality of life.  DESCRIPTION OF PROCEDURE:  After informed consent was obtained, appropriate left hip was marked, he was brought to the operating room and sat up on a stretcher where spinal anesthesia was obtained.  He was laid in supine position on a stretcher.  I  assessed his leg length and he is shorter on the  left than the right.  Traction boots were placed on both his feet and a Foley catheter was placed as well.  Next, he was placed supine on the Hana fracture table, the perineal post in place and both legs  in line with skeletal traction device and no traction applied.  His left operative hip was prepped and draped with DuraPrep and sterile drapes.  A timeout was called and he was identified as correct patient, correct left hip.  I then made an incision  just inferior and posterior to the anterior superior iliac spine and carried this obliquely down the leg.  We dissected down tensor fascia lata muscle.  Tensor fascia was then divided longitudinally to proceed with direct anterior approach to the hip.   We identified and cauterized circumflex vessels, we identified the hip capsule, opened the hip capsule in L-type format finding a moderate joint effusion and significant periarticular osteophytes around the lateral femoral head and neck.  The hip joint  was slightly subluxed.  We then made our femoral neck cut with oscillating saw just proximal to the lesser trochanter and completed this with an osteotome.  We placed a corkscrew guide in the femoral head and removed the femoral head in its entirety and  found a wide area devoid of cartilage.  I then placed a bent Hohmann over the medial acetabular rim and removed remnants of the acetabular labrum and other debris.  We then began reaming under direct visualization from a size 43 reamer in a stepwise  increments going up to a size 55 with all reamers placed under direct visualization.  Last reamer  was placed under direct fluoroscopy, so we could obtain a depth of reaming, our inclination and anteversion.  I then placed a real DePuy sector Gription  acetabular component, size 56 and a 36+4 polyethylene liner for that size acetabular component based on medialization and offset.  Attention was then turned to the femur with the leg externally rotated 120 degrees,  extended and adducted, we were able to  place a Mueller retractor medially and Hohman retractor behind the greater trochanter.  We released lateral joint capsule and used a box cutting osteotome to enter the femoral canal and a rongeur to lateralize, we then began broaching using the ACTIS  broaching system from a size 0 going up to a size 6.  With a size 6 in place, we trialed a high offset femoral neck and a 36+1.5 hip ball and reduced this and acetabulum.  We definitely needed more leg length.  We dislocated the hip, removed the trial  components.  We placed the real high offset ACTIS femoral component, size 6 followed by the real 36+8.5 ceramic hip ball and reduced this and acetabulum were pleased with leg length, offset, range of motion and stability, assessed mechanically and  radiographically.  We then irrigated the soft tissue with normal saline solution using pulsatile lavage.  We reapproximated remnants of the joint capsule with interrupted #1 Ethibond suture and #1 Vicryl was used to close the tensor fascia and 0 Vicryl  was used to close the deep tissue with 2-0 Vicryls closed in subcutaneous tissue.  Staples were used to close the skin.  An Aquacel dressing was applied.  He was taken off the Hana table and taken to recovery room in stable condition with all final  counts being correct.  No complications noted.  Of note, Rexene Edison, PA-C did assist during the entire case and his assistance was crucial for facilitating all aspects of this case.   PUS D: 11/25/2020 11:09:53 am T: 11/26/2020 2:01:00 am  JOB: [id number]/ [id number]

## 2020-11-26 NOTE — Progress Notes (Signed)
  Subjective: Samuel Dougherty is a 70 y.o. male s/p left THA.  They are POD 1.  Pt's pain is moderate but controlled.  Pt has ambulated with some difficulty.  Reports that initial physical therapy session after surgery went very well but he has not been able to walk more recently due to severe pain.  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  He feels he has not been getting enough pain medication and his lateral hip pain is out of control and not allowing him to mobilize well.  Denies any chest pain, shortness of breath, calf pain.  He is okay for discharge home today if he is able to get his pain controlled and ambulate well.  Objective: Vital signs in last 24 hours: Temp:  [97.5 F (36.4 C)-98 F (36.7 C)] 97.8 F (36.6 C) (08/13 1000) Pulse Rate:  [58-89] 84 (08/13 1000) Resp:  [11-19] 18 (08/13 1000) BP: (92-146)/(56-86) 124/79 (08/13 1000) SpO2:  [93 %-100 %] 95 % (08/13 1000)  Intake/Output from previous day: 08/12 0701 - 08/13 0700 In: 3674.8 [P.O.:1140; I.V.:2434.8; IV Piggyback:100] Out: 4150 [Urine:3950; Blood:200] Intake/Output this shift: Total I/O In: 240 [P.O.:240] Out: 0   Exam:  No gross blood or drainage overlying the dressing 2+ DP pulse Sensation intact distally in the left foot Able to dorsiflex and plantarflex the left foot No calf tenderness bilaterally   Labs: Recent Labs    11/26/20 0331  HGB 12.4*   Recent Labs    11/26/20 0331  WBC 14.6*  RBC 3.83*  HCT 36.9*  PLT 222   Recent Labs    11/26/20 0331  NA 135  K 3.4*  CL 98  CO2 27  BUN 26*  CREATININE 1.05  GLUCOSE 148*  CALCIUM 9.1   No results for input(s): LABPT, INR in the last 72 hours.  Assessment/Plan: Pt is POD 1 s/p left THA.    -Plan to discharge to home today or tomorrow pending patient's pain and PT eval  -WBAT with a walker  -Changed patient's pain management regimen to oxycodone 15 mg every 4  hours scheduled.  If this does not control patient's pain, will consider adjusting further pain management  -Dr. Magnus Ivan to see this afternoon     Julieanne Cotton 11/26/2020, 10:58 AM

## 2020-11-26 NOTE — Discharge Summary (Signed)
Patient ID: Samuel Dougherty MRN: [id number] DOB/AGE: 68-Aug-1952 68 y.o.  Admit date: 11/25/2020 Discharge date: 11/26/2020  Admission Diagnoses:  Principal Problem:   Unilateral primary osteoarthritis, left hip Active Problems:   Status post left hip replacement   Discharge Diagnoses:  Same  Past Medical History:  Diagnosis Date   Arthritis    GERD (gastroesophageal reflux disease)    Hypertension    Peripheral angiopathy (HCC)    Sleep apnea     Surgeries: Procedure(s): LEFT TOTAL HIP ARTHROPLASTY ANTERIOR APPROACH on 11/25/2020   Consultants:   Discharged Condition: Improved  Hospital Course: Samuel Dougherty is an 70 y.o. male who was admitted 11/25/2020 for operative treatment ofUnilateral primary osteoarthritis, left hip. Patient has severe unremitting pain that affects sleep, daily activities, and work/hobbies. After pre-op clearance the patient was taken to the operating room on 11/25/2020 and underwent  Procedure(s): LEFT TOTAL HIP ARTHROPLASTY ANTERIOR APPROACH.    Patient was given perioperative antibiotics:  Anti-infectives (From admission, onward)    Start     Dose/Rate Route Frequency Ordered Stop   11/25/20 1600  ceFAZolin (ANCEF) IVPB 1 g/50 mL premix        1 g 100 mL/hr over 30 Minutes Intravenous Every 6 hours 11/25/20 1354 11/25/20 2309   11/25/20 0700  ceFAZolin (ANCEF) IVPB 2g/100 mL premix        2 g 200 mL/hr over 30 Minutes Intravenous On call to O.R. 11/25/20 7445 11/25/20 1038   11/25/20 0648  vancomycin (VANCOCIN) IVPB 1000 mg/200 mL premix        1,000 mg 200 mL/hr over 60 Minutes Intravenous 60 min pre-op 11/25/20 0648 11/25/20 1008        Patient was given sequential compression devices, early ambulation, and chemoprophylaxis to prevent DVT.  Patient benefited maximally from hospital stay and there were no complications.    Recent vital signs: Patient Vitals for the past 24 hrs:  BP Temp Temp src Pulse Resp SpO2  11/26/20 1000 124/79  97.8 F (36.6 C) Oral 84 18 95 %  11/26/20 0448 119/73 -- -- 85 18 97 %  11/26/20 0003 123/73 -- -- 89 17 93 %  11/25/20 1932 138/74 98 F (36.7 C) Oral 86 19 97 %  11/25/20 1617 (!) 143/69 97.7 F (36.5 C) Oral 73 16 94 %  11/25/20 1354 (!) 146/86 97.7 F (36.5 C) Oral 66 16 --     Recent laboratory studies:  Recent Labs    11/26/20 0331  WBC 14.6*  HGB 12.4*  HCT 36.9*  PLT 222  NA 135  K 3.4*  CL 98  CO2 27  BUN 26*  CREATININE 1.05  GLUCOSE 148*  CALCIUM 9.1     Discharge Medications:   Allergies as of 11/26/2020       Reactions   Bee Venom    Dizziness   Codeine Nausea And Vomiting   Legacy System: CCA Onset Date: <blank> Substance Legacy/Cerner: codeine / codeine (Legacy value) Category: Drug Severity Legacy/Cerner: <blank> / Unknown Reaction(s): <blank> Comments: <blank>   Nitroglycerin    Other reaction(s): Other (See Comments) severe headache Legacy System: CCA Onset Date: <blank> Substance Legacy/Cerner: nitroglycerin / nitroglycerin (Legacy value) Category: Drug Severity Legacy/Cerner: <blank> / Unknown Reaction(s): severe headache Comments: <blank>        Medication List     TAKE these medications    aspirin EC 81 MG tablet Take 81 mg by mouth daily. Swallow whole.   atenolol 100 MG tablet Commonly known as:  TENORMIN Take 100 mg by mouth daily.   atorvastatin 40 MG tablet Commonly known as: LIPITOR Take 40 mg by mouth daily.   clopidogrel 75 MG tablet Commonly known as: PLAVIX Take 75 mg by mouth daily.   diclofenac Sodium 1 % Gel Commonly known as: VOLTAREN Apply 1 application topically 4 (four) times daily as needed (pain).   furosemide 40 MG tablet Commonly known as: LASIX Take 40 mg by mouth daily.   hydrochlorothiazide 25 MG tablet Commonly known as: HYDRODIURIL Take 25 mg by mouth daily.   magnesium oxide 400 MG tablet Commonly known as: MAG-OX Take 400 mg by mouth daily.   metFORMIN 500 MG  tablet Commonly known as: GLUCOPHAGE Take 500 mg by mouth daily with breakfast.   omeprazole 20 MG capsule Commonly known as: PRILOSEC Take 20 mg by mouth daily.   Oxycodone HCl 10 MG Tabs Take 10 mg by mouth 5 (five) times daily as needed (pain).   oxyCODONE 15 MG immediate release tablet Commonly known as: ROXICODONE Take 15 mg by mouth 5 (five) times daily.   phentermine 37.5 MG tablet Commonly known as: ADIPEX-P Take 37.5 mg by mouth every morning.   pramipexole 1 MG tablet Commonly known as: MIRAPEX Take 1 mg by mouth daily.   tadalafil 20 MG tablet Commonly known as: CIALIS Take 20 mg by mouth daily.   tamsulosin 0.4 MG Caps capsule Commonly known as: FLOMAX Take 0.4 mg by mouth daily.   vitamin C 1000 MG tablet Take 2,000 mg by mouth daily.   Vitamin D (Ergocalciferol) 1.25 MG (50000 UNIT) Caps capsule Commonly known as: DRISDOL Take 50,000 Units by mouth every Monday.               Durable Medical Equipment  (From admission, onward)           Start     Ordered   11/25/20 1354  DME 3 n 1  Once        11/25/20 1354   11/25/20 1354  DME Walker rolling  Once       Question Answer Comment  Walker: With 5 Inch Wheels   Patient needs a walker to treat with the following condition Status post left hip replacement      11/25/20 1354            Diagnostic Studies: DG Pelvis Portable  Result Date: 11/25/2020 CLINICAL DATA:  Left hip replacement EXAM: PORTABLE PELVIS 1-2 VIEWS COMPARISON:  09/28/2020 FINDINGS: Interval postsurgical changes from left hip arthroplasty. Arthroplasty components are in their expected alignment. No periprosthetic fracture or evidence of other complication. Expected postoperative changes within the overlying soft tissues. IMPRESSION: Interval postsurgical changes from left hip arthroplasty without evidence of complication. Electronically Signed   By: Duanne Guess D.O.   On: 11/25/2020 14:19   DG C-Arm 1-60 Min-No  Report  Result Date: 11/25/2020 CLINICAL DATA:  Left hip arthroplasty EXAM: OPERATIVE LEFT HIP (WITH PELVIS IF PERFORMED) INTRAOPERATIVE VIEWS TECHNIQUE: Fluoroscopic spot image(s) were submitted for interpretation post-operatively. COMPARISON:  09/28/2020 FINDINGS: 8 C-arm fluoroscopic images were obtained intraoperatively and submitted for post operative interpretation. Images demonstrate subsequent placement of left total hip arthroplasty hardware without apparent intraoperative complication. 24 seconds of fluoroscopy time was utilized. Please see the performing provider's procedural report for further detail. IMPRESSION: As above. Electronically Signed   By: Duanne Guess D.O.   On: 11/25/2020 13:37   DG HIP OPERATIVE UNILAT W OR W/O PELVIS LEFT  Result Date: 11/25/2020  CLINICAL DATA:  Left hip arthroplasty EXAM: OPERATIVE LEFT HIP (WITH PELVIS IF PERFORMED) INTRAOPERATIVE VIEWS TECHNIQUE: Fluoroscopic spot image(s) were submitted for interpretation post-operatively. COMPARISON:  09/28/2020 FINDINGS: 8 C-arm fluoroscopic images were obtained intraoperatively and submitted for post operative interpretation. Images demonstrate subsequent placement of left total hip arthroplasty hardware without apparent intraoperative complication. 24 seconds of fluoroscopy time was utilized. Please see the performing provider's procedural report for further detail. IMPRESSION: As above. Electronically Signed   By: Duanne Guess D.O.   On: 11/25/2020 13:37    Disposition: Discharge disposition: 01-Home or Self Care          Follow-up Information     Kathryne Hitch, MD Follow up in 2 week(s).   Specialty: Orthopedic Surgery Contact information: [address] [place] [address] [phone]                  Signed: Kathryne Hitch 11/26/2020, 1:39 PM

## 2020-11-26 NOTE — Progress Notes (Signed)
Physical Therapy Treatment Patient Details Name: Samuel Dougherty MRN: [id number] DOB: 03-16-1951 Today's Date: 11/26/2020    History of Present Illness Patient is 70 y.o. male s/p Lt THA anterior approach on 11/25/20 with PMH significant for HTN, GERD, OA.    PT Comments    Progressing with mobility. Pt reports some pain control issues-encouraged him to continue to discuss this with RN/MD. Will plan to have a 2nd session to practice stair negotiation if able.     Follow Up Recommendations  Follow surgeon's recommendation for DC plan and follow-up therapies (plan is for HHPT)     Equipment Recommendations  Rolling walker with 5" wheels;3in1 (PT)    Recommendations for Other Services       Precautions / Restrictions Precautions Precautions: Fall Restrictions Weight Bearing Restrictions: No Other Position/Activity Restrictions: WBAT    Mobility  Bed Mobility Overal bed mobility: Needs Assistance Bed Mobility: Supine to Sit     Supine to sit: Min assist;HOB elevated     General bed mobility comments: Assist for L LE. Increased time. Cues provided. Pt used bedrail    Transfers Overall transfer level: Needs assistance Equipment used: Rolling walker (2 wheeled) Transfers: Sit to/from Stand Sit to Stand: Min assist;From elevated surface         General transfer comment: Assist to power up, steady, control descent. Cues for safety, technique, hand/LE placement. Increased time.  Ambulation/Gait Ambulation/Gait assistance: Min assist;Min guard Gait Distance (Feet): 75 Feet Assistive device: Rolling walker (2 wheeled) Gait Pattern/deviations: Step-to pattern;Step-through pattern;Decreased stride length     General Gait Details: Progressed to step through pattern as distance increased. Intermittent assist to steady but no overt LOB with RW use.   Stairs             Wheelchair Mobility    Modified Rankin (Stroke Patients Only)       Balance Overall  balance assessment: Needs assistance         Standing balance support: Bilateral upper extremity supported Standing balance-Leahy Scale: Poor                              Cognition Arousal/Alertness: Awake/alert Behavior During Therapy: WFL for tasks assessed/performed Overall Cognitive Status: Within Functional Limits for tasks assessed                                        Exercises Total Joint Exercises Ankle Circles/Pumps: AROM;Both;10 reps Quad Sets: AROM;Both;10 reps Heel Slides: AAROM;10 reps Hip ABduction/ADduction: AAROM;Left;10 reps    General Comments        Pertinent Vitals/Pain Pain Assessment: 0-10 Pain Score: 8  Pain Location: L hip/thigh Pain Descriptors / Indicators: Discomfort;Sore Pain Intervention(s): Limited activity within patient's tolerance;Monitored during session;Ice applied;Repositioned    Home Living                      Prior Function            PT Goals (current goals can now be found in the care plan section) Progress towards PT goals: Progressing toward goals    Frequency    7X/week      PT Plan Current plan remains appropriate    Co-evaluation              AM-PAC PT "6 Clicks" Mobility   Outcome Measure  Help  needed turning from your back to your side while in a flat bed without using bedrails?: A Little Help needed moving from lying on your back to sitting on the side of a flat bed without using bedrails?: A Little Help needed moving to and from a bed to a chair (including a wheelchair)?: A Little Help needed standing up from a chair using your arms (e.g., wheelchair or bedside chair)?: A Little Help needed to walk in hospital room?: A Little Help needed climbing 3-5 steps with a railing? : A Little 6 Click Score: 18    End of Session Equipment Utilized During Treatment: Gait belt Activity Tolerance: Patient tolerated treatment well;Patient limited by pain Patient left:  in bed;with call bell/phone within reach   PT Visit Diagnosis: Other abnormalities of gait and mobility (R26.89);Pain Pain - Right/Left: Left Pain - part of body: Hip     Time: 1152-1222 PT Time Calculation (min) (ACUTE ONLY): 30 min  Charges:  $Gait Training: 8-22 mins $Therapeutic Exercise: 8-22 mins                         Faye Ramsay, PT Acute Rehabilitation  Office: [phone] Pager: [phone]

## 2020-11-26 NOTE — Progress Notes (Signed)
Rn called [employer] weekend rep to discuss equipment delivery. Rn left message for equipment company. Rn will continue to monitor.

## 2020-11-28 ENCOUNTER — Telehealth: Payer: Self-pay

## 2020-11-28 NOTE — Telephone Encounter (Signed)
Lvm informing him

## 2020-11-28 NOTE — Telephone Encounter (Signed)
John with [facility] called requesting verbal orders for patient for   2 times a week for 4 weeks for pt  When can the patient take a bath ? When can we remove Aquacel dressing or is it coming off at this appt ?

## 2020-11-28 NOTE — Telephone Encounter (Signed)
Please advise 

## 2020-11-29 ENCOUNTER — Encounter (HOSPITAL_COMMUNITY): Payer: Self-pay | Admitting: Orthopaedic Surgery

## 2020-11-30 ENCOUNTER — Telehealth: Payer: Self-pay | Admitting: Orthopaedic Surgery

## 2020-11-30 NOTE — Telephone Encounter (Signed)
Pt wife called and states her husband is being hand headed and wanting to drive and do a lot f things that he shouldn't do. Can you please call her so she can ask a few questions.  CB [phone]

## 2020-12-05 ENCOUNTER — Telehealth: Payer: Self-pay

## 2020-12-05 NOTE — Telephone Encounter (Signed)
Patient aware swelling is normal and to elevate; also he may remove bandage

## 2020-12-05 NOTE — Telephone Encounter (Signed)
Pt stating that his leg Is swelling and his bandage is coming off and he needs to know how to replace it or if it needs to be replaced at all. Pt also stated that he fell a few days ago he doesn't think he injured his hip but he did want to advise that he did.

## 2020-12-08 ENCOUNTER — Encounter: Payer: Medicare PPO | Admitting: Orthopaedic Surgery

## 2020-12-13 ENCOUNTER — Ambulatory Visit (INDEPENDENT_AMBULATORY_CARE_PROVIDER_SITE_OTHER): Payer: Medicare PPO | Admitting: Orthopaedic Surgery

## 2020-12-13 ENCOUNTER — Encounter: Payer: Self-pay | Admitting: Orthopaedic Surgery

## 2020-12-13 DIAGNOSIS — Z96642 Presence of left artificial hip joint: Secondary | ICD-10-CM

## 2020-12-13 NOTE — Progress Notes (Signed)
HPI: Mr. Rawlins returns today status post left total hip arthroplasty 11/25/2020.  He states he is overall doing well.  He feels that things are getting better each day.  Had no fevers chills.  He is on aspirin twice daily for DVT prophylaxis he was on 81 mg once daily prior to surgery.  He has had no fevers chills.  Physical exam: Left hip surgical incisions healing well no signs of infection.  Slight seroma.  Left calf supple nontender.  Dorsiflexion plantarflexion left ankle intact.  Impression: Status post left total hip arthroplasty  Plan: He will go back on his 81 mg aspirin once daily that he was on prior to surgery.  Scar tissue mobilization encouraged.  We will slowly increase his activities as tolerated.  Follow-up with [place] in 1 month sooner if there is any questions concerns or signs of infection or wound dehiscence.  Questions encouraged and answered by Dr. Magnus Ivan and myself.

## 2021-01-05 ENCOUNTER — Telehealth: Payer: Self-pay

## 2021-01-05 NOTE — Telephone Encounter (Signed)
Pt called and wants his apt moved to a Thursday or Friday since he has those days off. I told the patient that [facility] next available is the week of the 17th of October I offered him an apt with [facility] and he said that that wasn't soon enough.   Please advise

## 2021-01-10 ENCOUNTER — Other Ambulatory Visit: Payer: Self-pay

## 2021-01-10 ENCOUNTER — Encounter: Payer: Self-pay | Admitting: Orthopaedic Surgery

## 2021-01-10 ENCOUNTER — Ambulatory Visit (INDEPENDENT_AMBULATORY_CARE_PROVIDER_SITE_OTHER): Payer: Medicare PPO | Admitting: Orthopaedic Surgery

## 2021-01-10 DIAGNOSIS — Z96642 Presence of left artificial hip joint: Secondary | ICD-10-CM

## 2021-01-10 NOTE — Progress Notes (Signed)
The patient comes in today for his 6-week status post a left total hip arthroplasty.  He said he is doing great overall and did ride a motorcycle to [place] the other week.  He does get an occasional pain in the groin with walking and there is some stiffness but overall he is making good progress.  He is walking without an assistive device and he has no significant limp.  Examination of his left hip shows it moves smoothly and fluidly with no issues at all.  From my standpoint he will continue to increase his activities as comfort allows.  I gave him reassurance that a lot of this pain will slowly subside.  From my standpoint, I will see him back in 6 months with a standing low AP pelvis and lateral of his left operative hip.

## 2021-07-10 ENCOUNTER — Ambulatory Visit: Payer: Medicare PPO | Admitting: Orthopaedic Surgery

## 2021-07-24 ENCOUNTER — Ambulatory Visit: Payer: Medicare PPO | Admitting: Orthopaedic Surgery

## 2021-08-16 ENCOUNTER — Ambulatory Visit: Payer: Medicare PPO | Admitting: Orthopaedic Surgery

## 2022-04-23 ENCOUNTER — Ambulatory Visit (INDEPENDENT_AMBULATORY_CARE_PROVIDER_SITE_OTHER): Payer: Medicare PPO

## 2022-04-23 ENCOUNTER — Ambulatory Visit: Payer: Self-pay

## 2022-04-23 ENCOUNTER — Ambulatory Visit (INDEPENDENT_AMBULATORY_CARE_PROVIDER_SITE_OTHER): Payer: Medicare PPO | Admitting: Sports Medicine

## 2022-04-23 ENCOUNTER — Ambulatory Visit (INDEPENDENT_AMBULATORY_CARE_PROVIDER_SITE_OTHER): Payer: Medicare PPO | Admitting: Orthopaedic Surgery

## 2022-04-23 DIAGNOSIS — Z96642 Presence of left artificial hip joint: Secondary | ICD-10-CM

## 2022-04-23 DIAGNOSIS — M25552 Pain in left hip: Secondary | ICD-10-CM

## 2022-04-23 MED ORDER — LIDOCAINE HCL 1 % IJ SOLN
2.0000 mL | INTRAMUSCULAR | Status: AC | PRN
  Administered 2022-04-23: 2 mL

## 2022-04-23 MED ORDER — METHYLPREDNISOLONE ACETATE 40 MG/ML IJ SUSP
40.0000 mg | INTRAMUSCULAR | Status: AC | PRN
  Administered 2022-04-23: 40 mg via INTRA_ARTICULAR

## 2022-04-23 NOTE — Progress Notes (Signed)
The patient is someone well-known to me.  He is a 72 year old gentleman who is a year and a half out from a left total hip arthroplasty.  He says he started to have hip pain recently and he points to the groin area as a source of his pain.  He is ambulate with a cane.  He states that it feels like a pulled muscle.  He is an avid motorcycle rider and says its gotten to where he cannot ride his motorcycle well because of this pain.  He denies any recent illnesses and denies any injuries.  On exam gentle flexion of his left hip and some rotation does cause pain in the groin and seems to be at the iliopsoas tendon.  There is no blocks or rotation and his leg lengths are equal.  A standing AP pelvis and lateral left hip shows a well-seated total hip arthroplasty that is bone ingrown with no complicating features.  I do not see any cortical regularities and I reviewed past films stated there is not any significant overhang of the acetabular component.  He is experiencing some type of irritation of his left hip.  I would like to send him to my partner Dr. Rolena Infante for an ultrasound of guided injection in his left hip around the iliopsoas tendon to see if this will help decrease his symptoms and help with a diagnosis as well and treatment of the pain that he is having.  The patient agrees with this treatment.  He did drive a long distance today so he is hoping to have this done today.  He arrived to his afternoon appointment for a morning clinic so it may be difficult to get him set up with someone today but we will see.

## 2022-04-23 NOTE — Progress Notes (Signed)
   Procedure Note  Patient: Samuel Dougherty             Date of Birth: 07/27/1950           MRN: [id number]             Visit Date: 04/23/2022  Procedures: Visit Diagnoses:  1. Status post left hip replacement   2. Pain in left hip    Large Joint Inj: L hip joint on 04/23/2022 11:47 AM Indications: diagnostic evaluation and pain Details: 22 G 3.5 in needle, ultrasound-guided anterior approach Medications: 2 mL lidocaine 1 %; 40 mg methylPREDNISolone acetate 40 MG/ML Outcome: tolerated well, no immediate complications  Procedure: US-guided iliopsoas tendon injection, left hip After discussion on risks/benefits/indications and informed verbal consent was obtained, a timeout was performed. Patient was lying supine on exam table. The hip was cleaned with chloraprep and alcohol swabs. Then utilizing ultrasound guidance placed in an oblique axial plane, the patient's iliopsoas tendon was identified in short axis. The femoral neurovasculature was identified medial to the tendon and avoided during the procedure. The iliopsoas bursa was subsequently injected with a 2:2:1 lidocaine:dextrose 5%:methylprednisolone via an in-plane approach with spread around the iliopsoas tendon. Patient tolerated procedure well without immediate complications.  Procedure, treatment alternatives, risks and benefits explained, specific risks discussed. Consent was given by the patient. Immediately prior to procedure a time out was called to verify the correct patient, procedure, equipment, support staff and site/side marked as required. Patient was prepped and draped in the usual sterile fashion.     - I evaluated the patient about 10 minutes post-injection and he had quite significant improvement in pain and range of motion; minimal pain with active hip flexion - follow-up with Dr. Ninfa Linden as indicated; I am happy to see them as needed  Elba Barman, DO [place]  This note was dictated using Dragon naturally speaking software and may contain errors in syntax, spelling, or content which have not been identified prior to signing this note.

## 2022-08-16 IMAGING — RF DG HIP (WITH PELVIS) OPERATIVE*L*
1 series · 12 of 12 positions shown · non-contrast
Comparison: 09/28/2020

CLINICAL DATA: Left hip arthroplasty

EXAM:
OPERATIVE LEFT HIP (WITH PELVIS IF PERFORMED) INTRAOPERATIVE VIEWS
TECHNIQUE: Fluoroscopic spot image(s) were submitted for interpretation
post-operatively.

[Series 1: unknown protocol · 0.20mm/px · 12 of 12 slices shown]
[im 1/12]
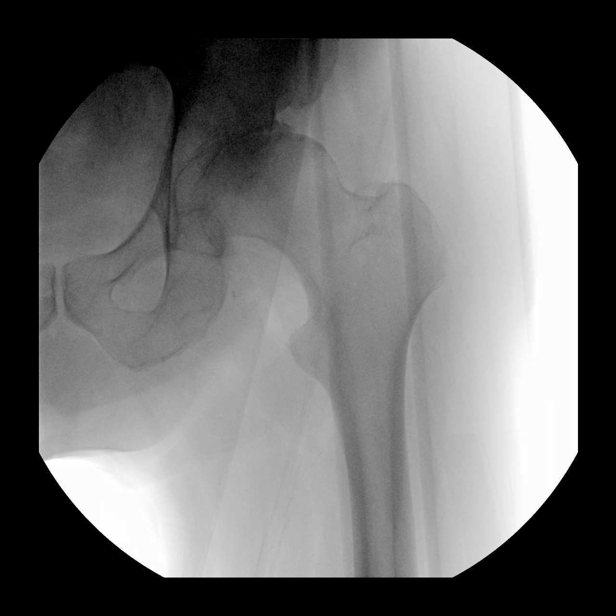
[im 2/12]
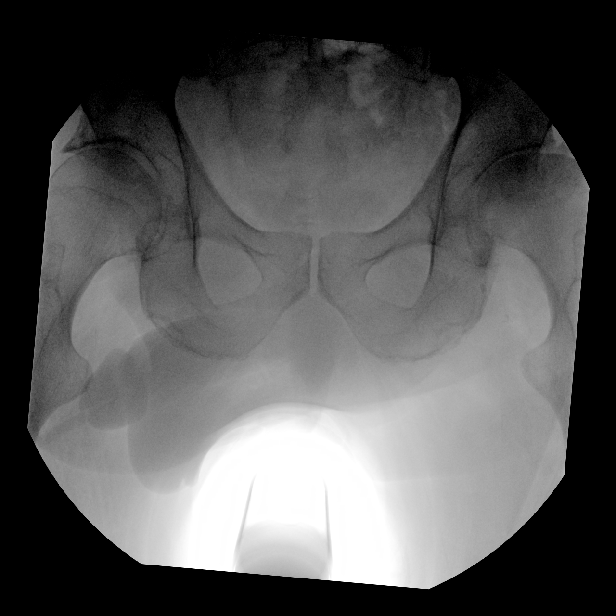
[im 3/12]
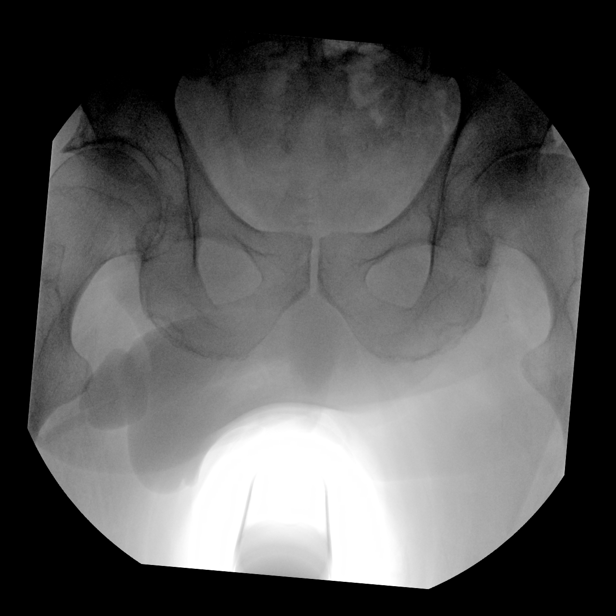
[im 4/12]
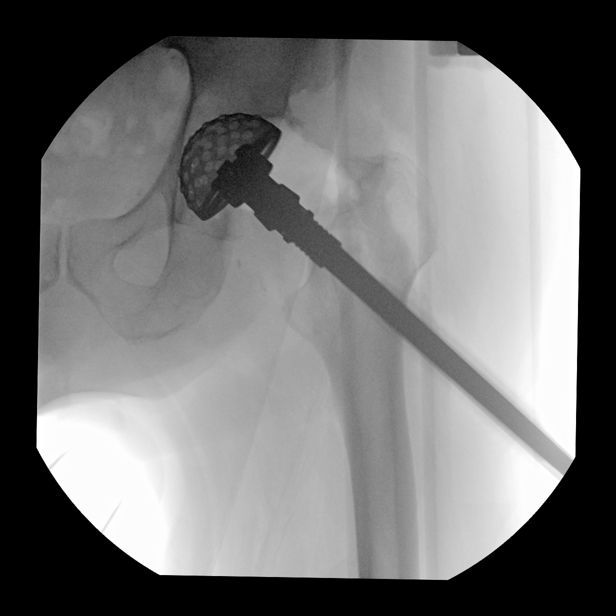
[im 5/12]
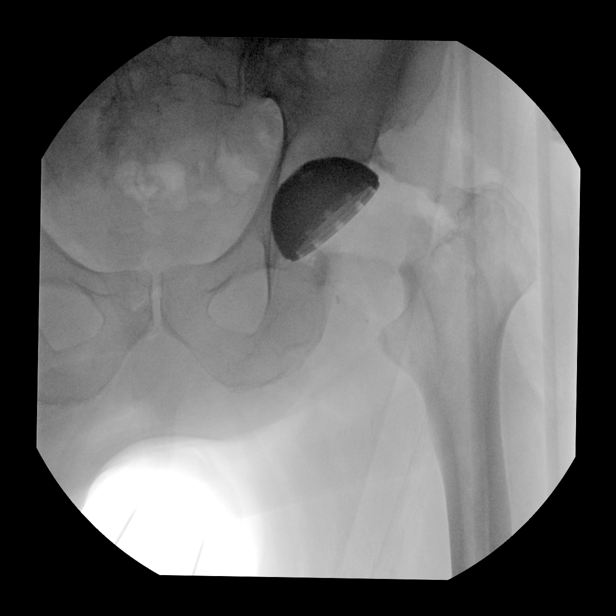
[im 6/12]
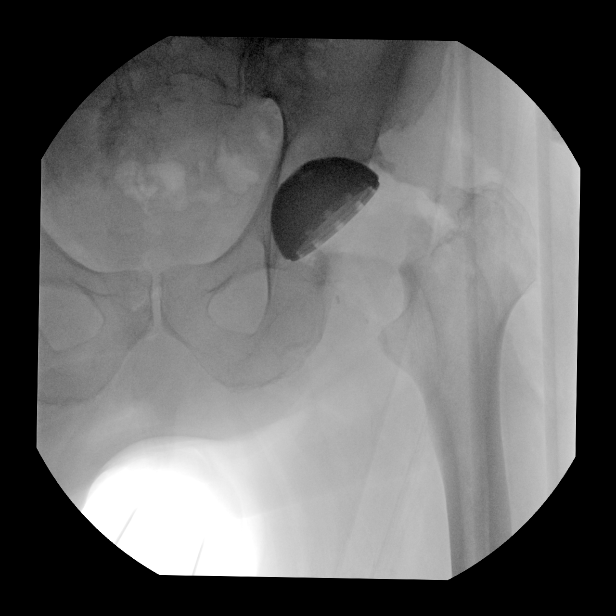
[im 7/12]
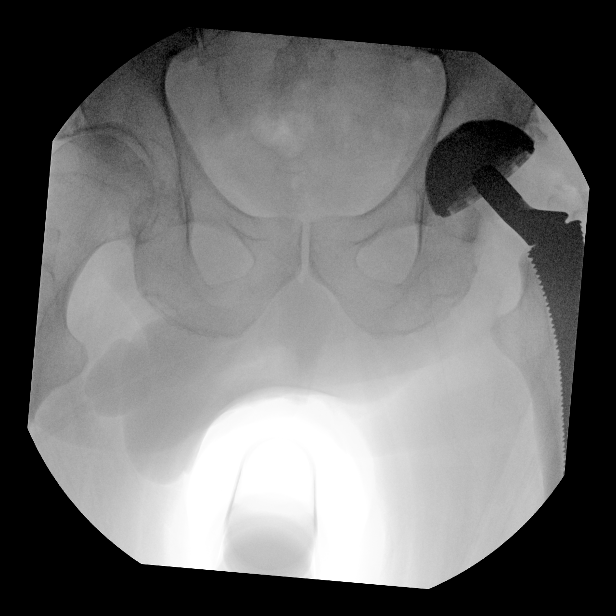
[im 8/12]
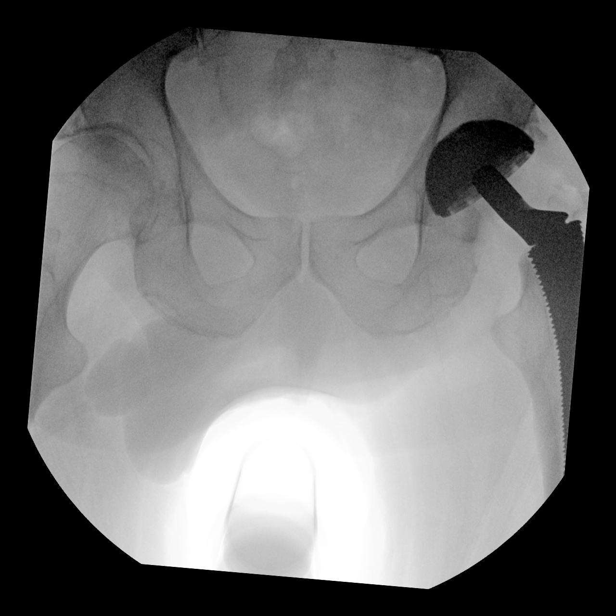
[im 9/12]
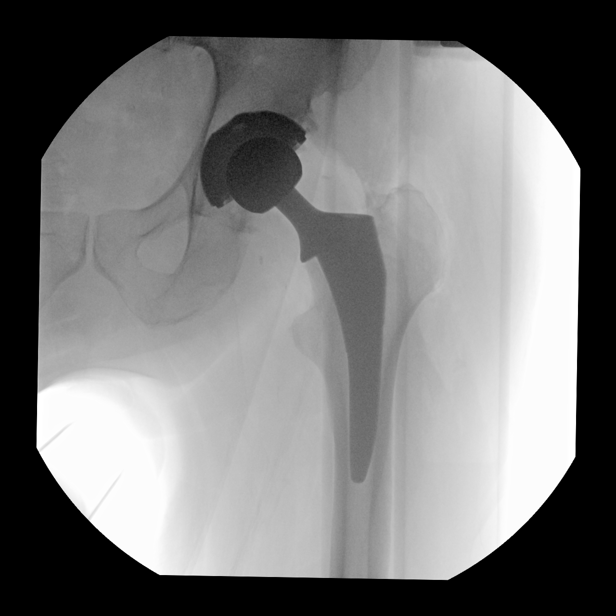
[im 10/12]
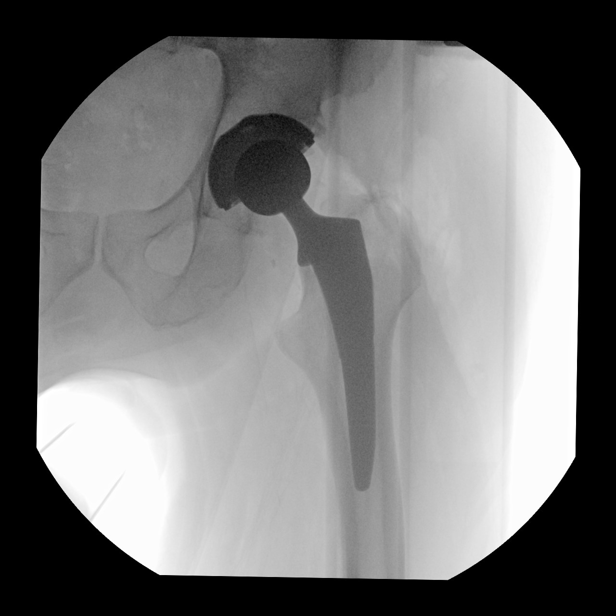
[im 11/12]
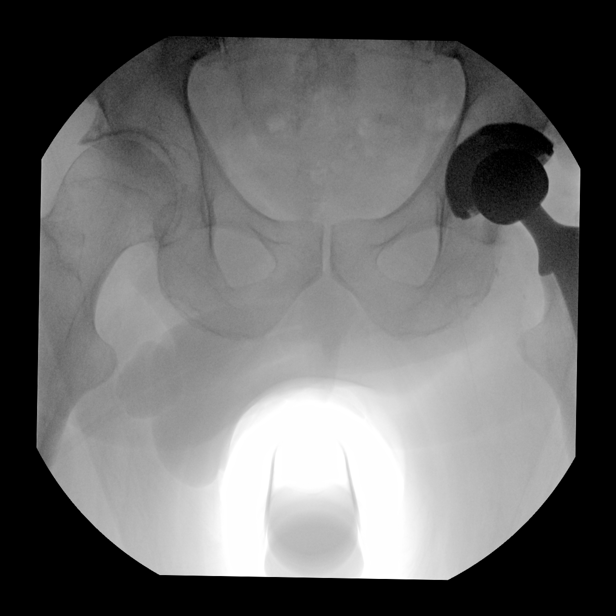
[im 12/12]
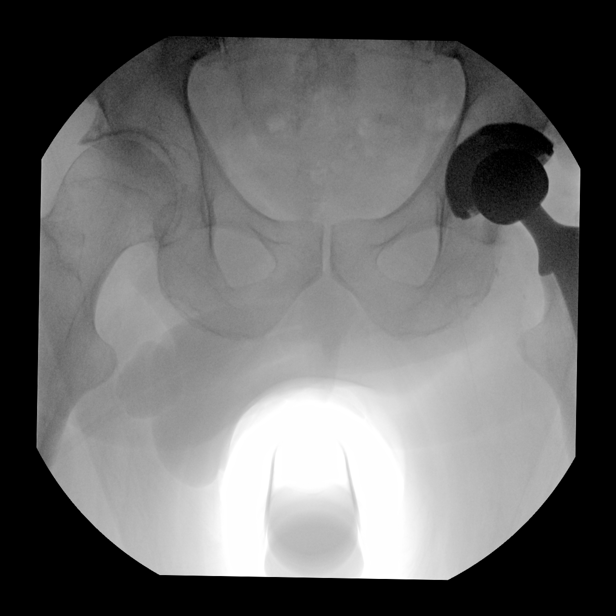

[12 of 12 positions shown; findings below may reference images not displayed]

FINDINGS: 8 C-arm fluoroscopic images were obtained intraoperatively and
submitted for post operative interpretation. Images demonstrate
subsequent placement of left total hip arthroplasty hardware without
apparent intraoperative complication. 24 seconds of fluoroscopy time
was utilized. Please see the performing provider's procedural report
for further detail.
IMPRESSION: As above.

## 2022-08-16 IMAGING — DX DG PORTABLE PELVIS
1 series · 1 of 1 positions shown · non-contrast
Comparison: 09/28/2020

CLINICAL DATA: Left hip replacement

EXAM:
PORTABLE PELVIS 1-2 VIEWS

[pelvis ap]
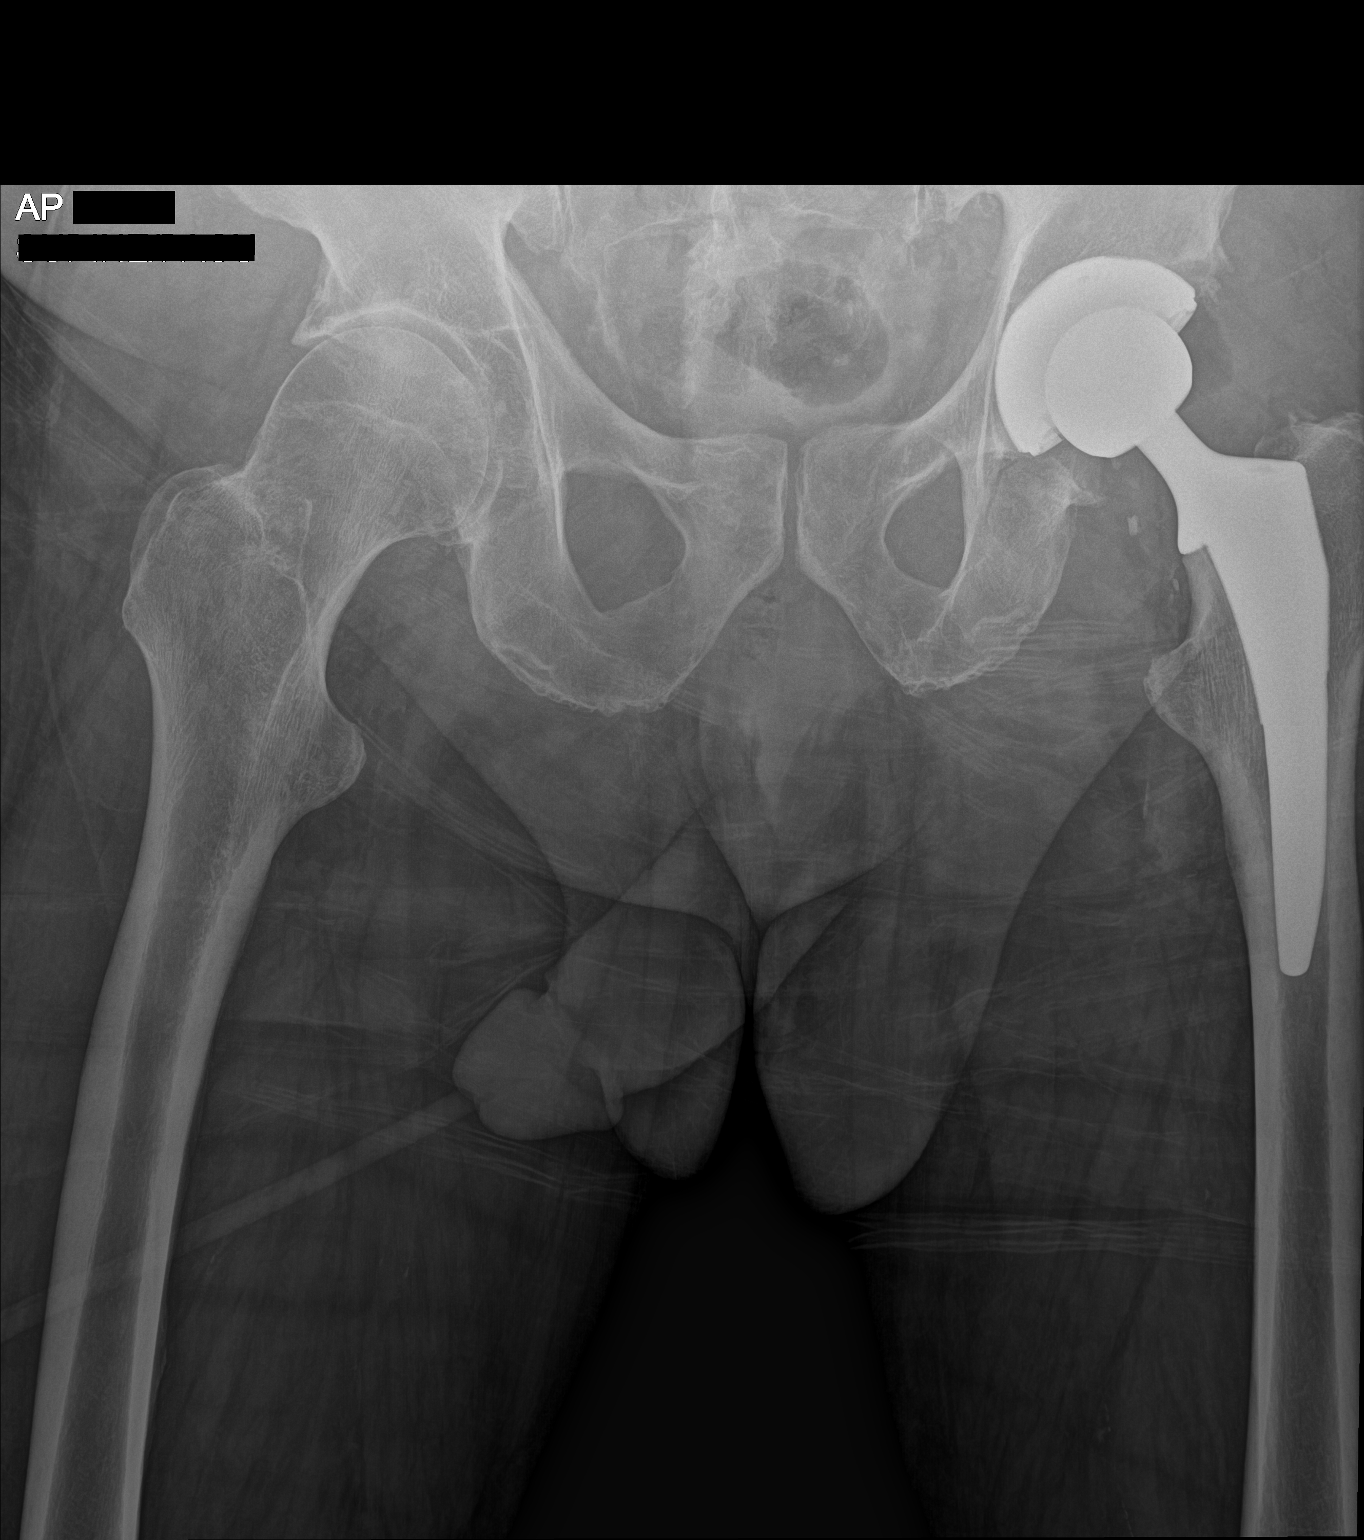

[1 of 1 positions shown; findings below may reference images not displayed]

FINDINGS: Interval postsurgical changes from left hip arthroplasty.
Arthroplasty components are in their expected alignment. No
periprosthetic fracture or evidence of other complication. Expected
postoperative changes within the overlying soft tissues.
IMPRESSION: Interval postsurgical changes from left hip arthroplasty without
evidence of complication.

## 2022-08-16 IMAGING — RF DG C-ARM 1-60 MIN-NO REPORT
1 series · 12 of 12 positions shown · non-contrast
Comparison: 09/28/2020

CLINICAL DATA: Left hip arthroplasty

EXAM:
OPERATIVE LEFT HIP (WITH PELVIS IF PERFORMED) INTRAOPERATIVE VIEWS
TECHNIQUE: Fluoroscopic spot image(s) were submitted for interpretation
post-operatively.

[Series 1: unknown protocol · 0.20mm/px · 12 of 12 slices shown]
[im 1/12]
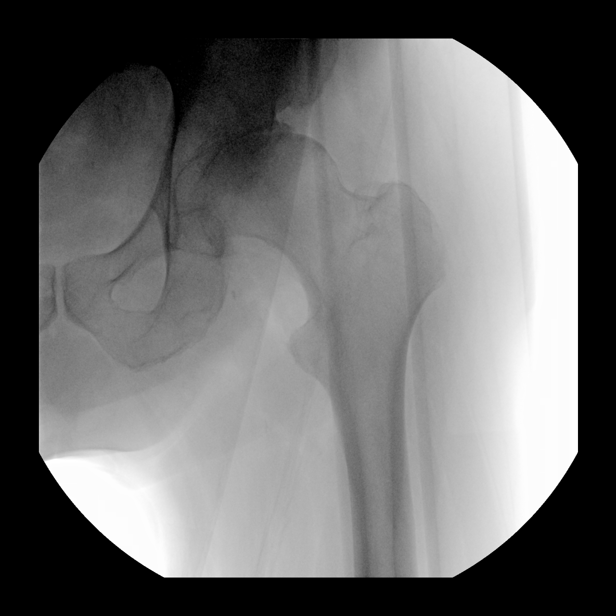
[im 2/12]
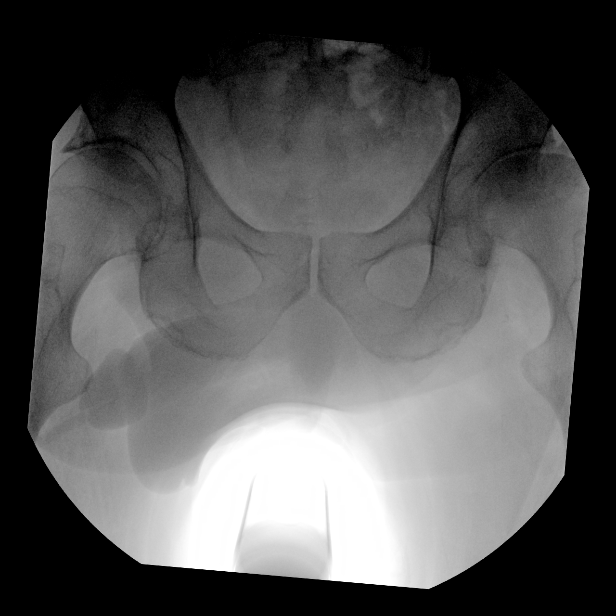
[im 3/12]
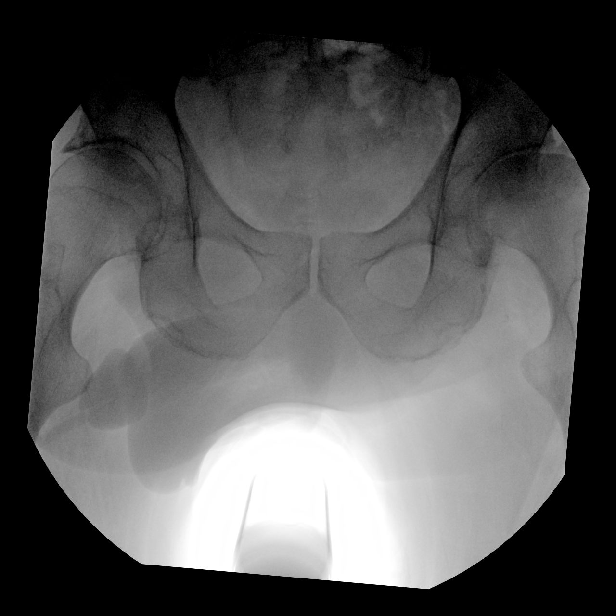
[im 4/12]
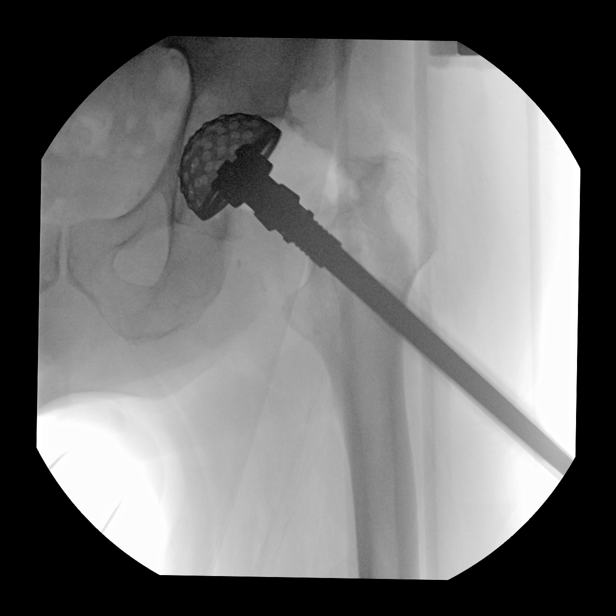
[im 5/12]
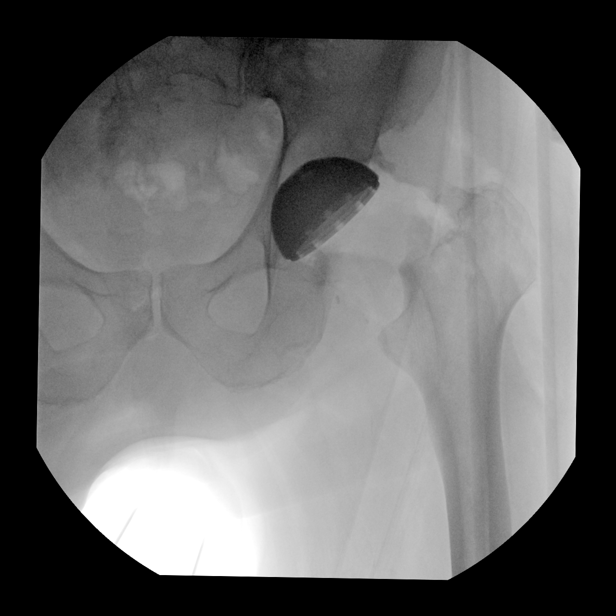
[im 6/12]
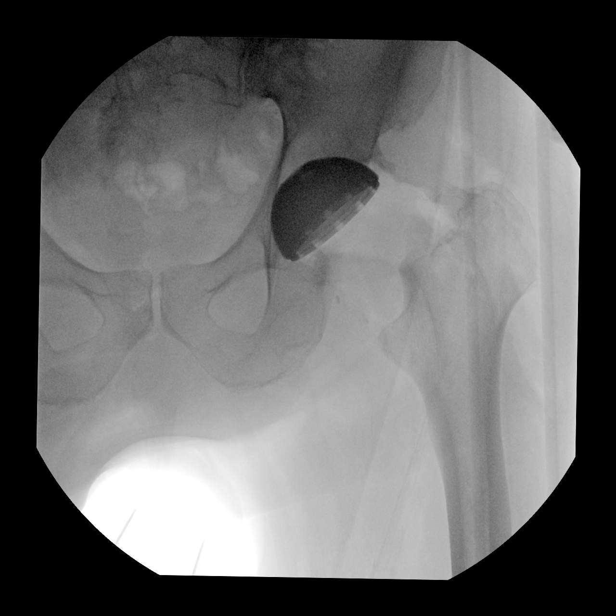
[im 7/12]
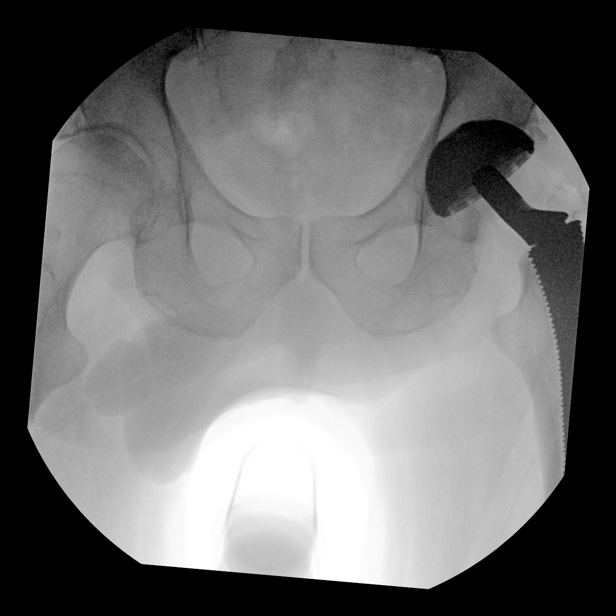
[im 8/12]
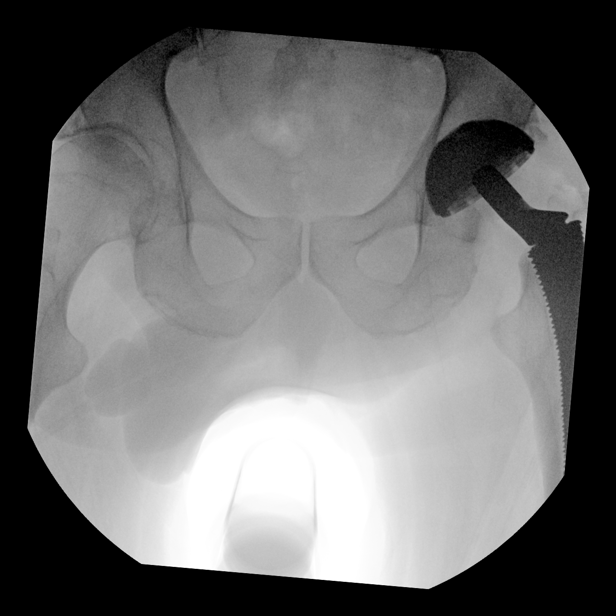
[im 9/12]
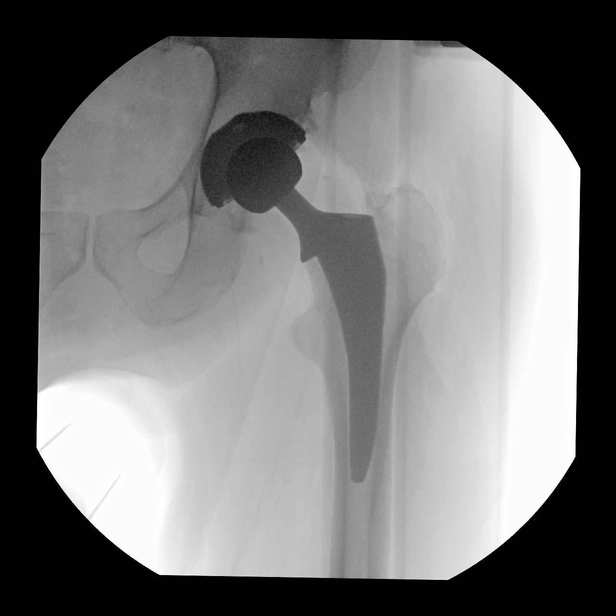
[im 10/12]
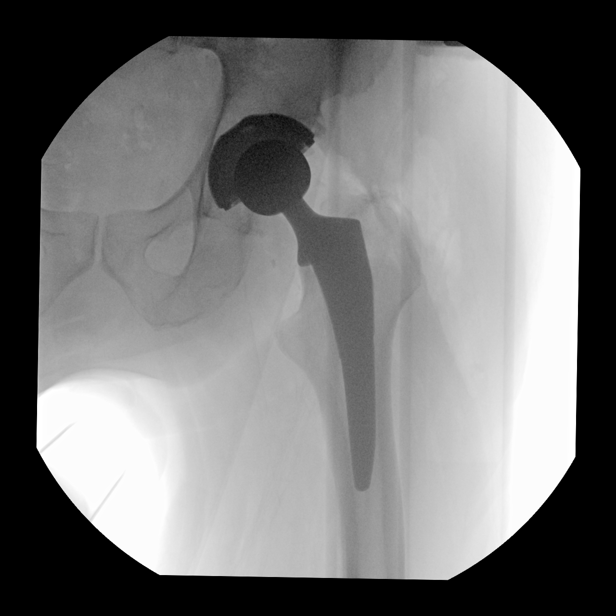
[im 11/12]
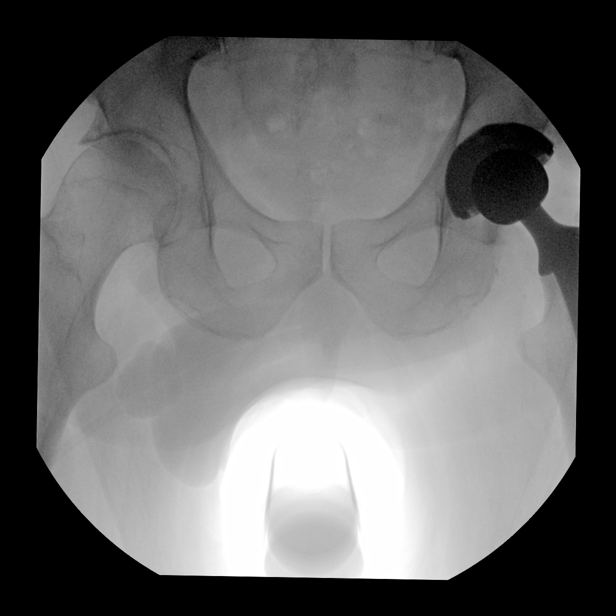
[im 12/12]
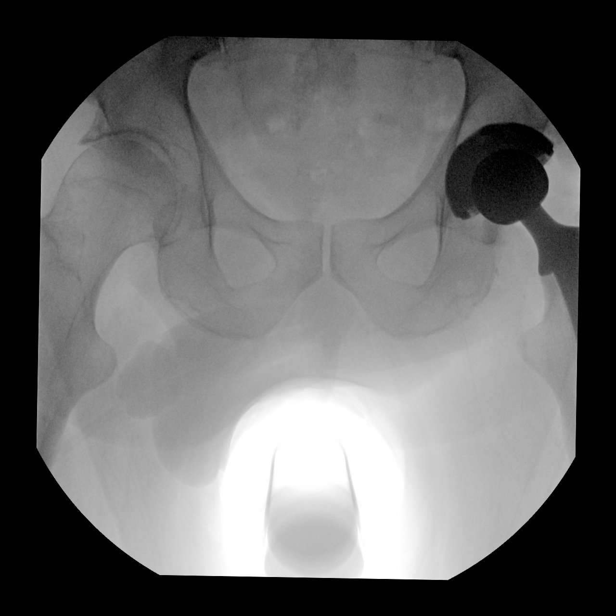

[12 of 12 positions shown; findings below may reference images not displayed]

FINDINGS: 8 C-arm fluoroscopic images were obtained intraoperatively and
submitted for post operative interpretation. Images demonstrate
subsequent placement of left total hip arthroplasty hardware without
apparent intraoperative complication. 24 seconds of fluoroscopy time
was utilized. Please see the performing provider's procedural report
for further detail.
IMPRESSION: As above.
